# Patient Record
Sex: Female | Born: 1965 | Hispanic: No | Marital: Married | Smoking: Never smoker
Health system: Southern US, Community
[De-identification: ages and names within clinical notes are randomized; demographics above are authoritative.]

## PROBLEM LIST (undated history)

## (undated) ENCOUNTER — Ambulatory Visit: Discharge status: Absent without leave | Payer: Self-pay

## (undated) DIAGNOSIS — I5022 Chronic systolic (congestive) heart failure: Secondary | ICD-10-CM

## (undated) DIAGNOSIS — M722 Plantar fascial fibromatosis: Secondary | ICD-10-CM

## (undated) DIAGNOSIS — E66811 Obesity, class 1: Secondary | ICD-10-CM

## (undated) DIAGNOSIS — I493 Ventricular premature depolarization: Secondary | ICD-10-CM

## (undated) HISTORY — PX: ABLATION ON ENDOMETRIOSIS: SHX5787

---

## 2006-02-06 ENCOUNTER — Emergency Department: Payer: Self-pay | Admitting: Emergency Medicine

## 2006-02-07 ENCOUNTER — Emergency Department (HOSPITAL_COMMUNITY): Admission: EM | Admit: 2006-02-07 | Discharge: 2006-02-07 | Payer: Self-pay | Admitting: Emergency Medicine

## 2007-04-27 ENCOUNTER — Ambulatory Visit: Payer: Self-pay | Admitting: Internal Medicine

## 2007-08-30 ENCOUNTER — Emergency Department: Payer: Self-pay | Admitting: Emergency Medicine

## 2007-09-04 ENCOUNTER — Encounter: Payer: Self-pay | Admitting: Maternal & Fetal Medicine

## 2007-10-02 ENCOUNTER — Encounter: Payer: Self-pay | Admitting: Maternal and Fetal Medicine

## 2007-12-12 ENCOUNTER — Ambulatory Visit: Payer: Self-pay | Admitting: Certified Nurse Midwife

## 2008-01-25 ENCOUNTER — Observation Stay: Payer: Self-pay | Admitting: Obstetrics and Gynecology

## 2008-03-18 ENCOUNTER — Inpatient Hospital Stay: Payer: Self-pay | Admitting: Certified Nurse Midwife

## 2009-09-09 IMAGING — US US OB DETAIL+14 WK - NRPT [facility]
1 series · 14 of 28 positions shown · non-contrast
Comparison: none

[Series 1: us ob detail+14 wk - nrpt [facility] · 0.37mm/px · 14 of 56 slices shown]
[im 3/56]
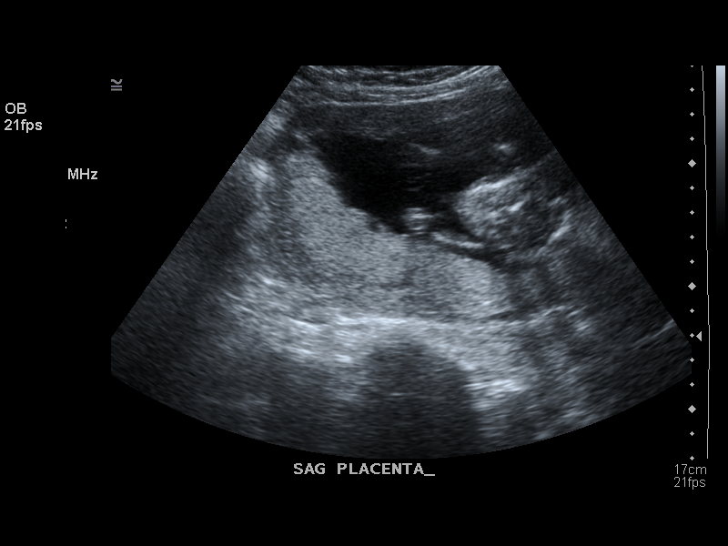
[im 7/56]
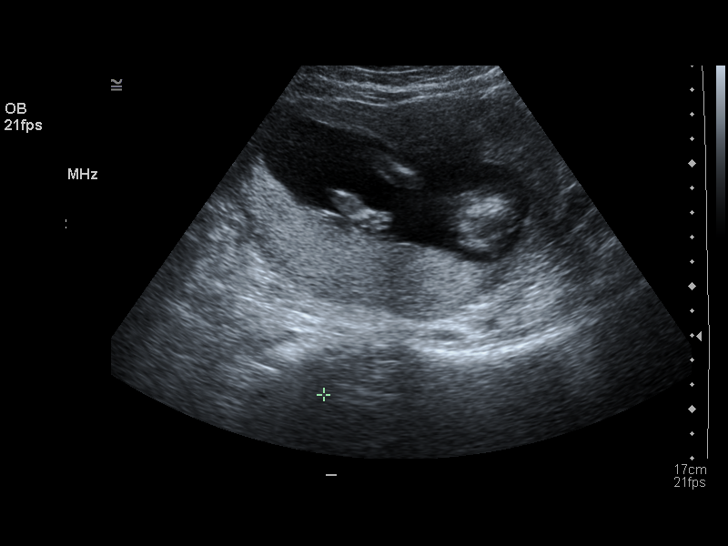
[im 11/56]
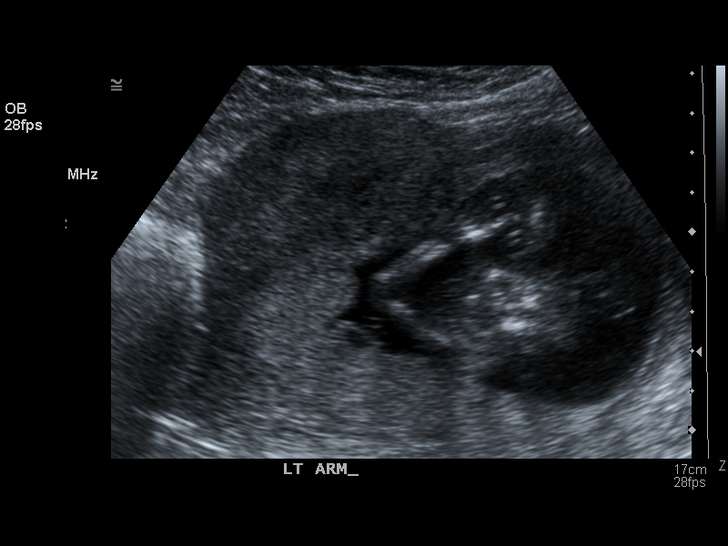
[im 15/56]
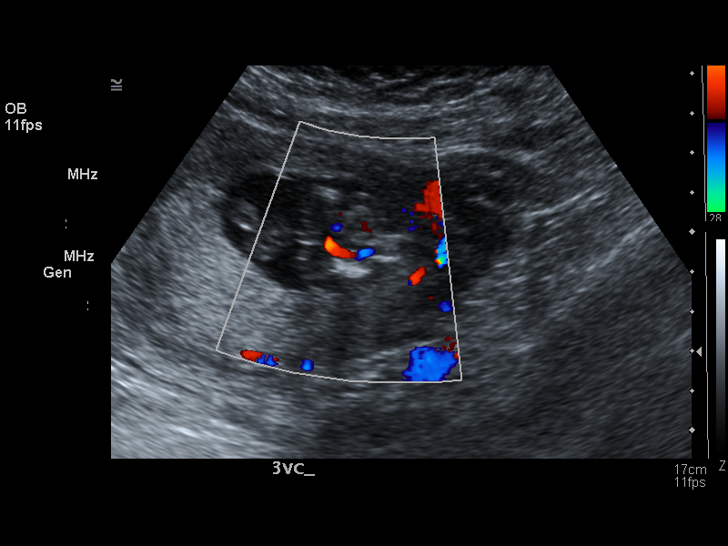
[im 19/56]
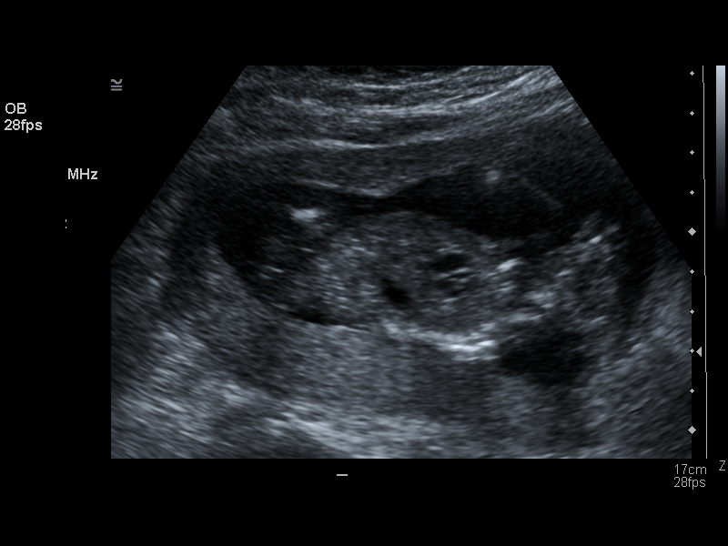
[im 23/56]
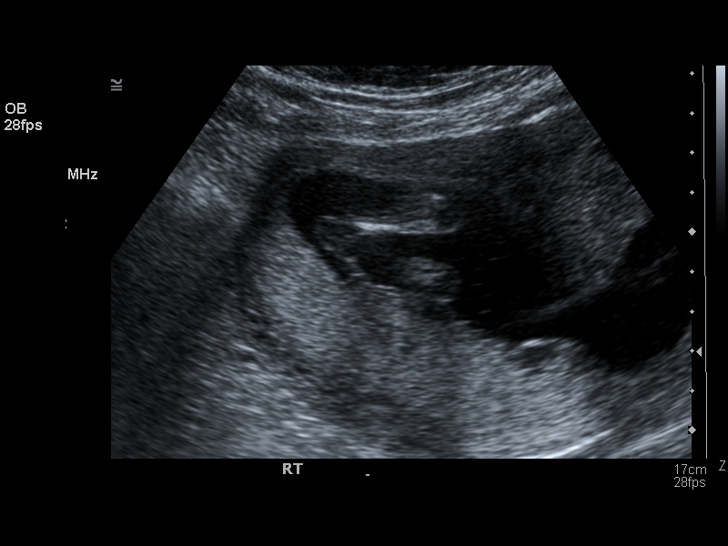
[im 27/56]
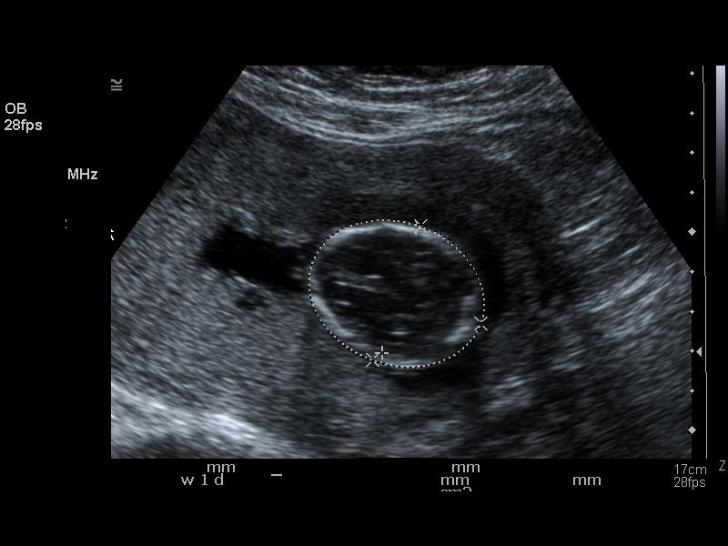
[im 31/56]
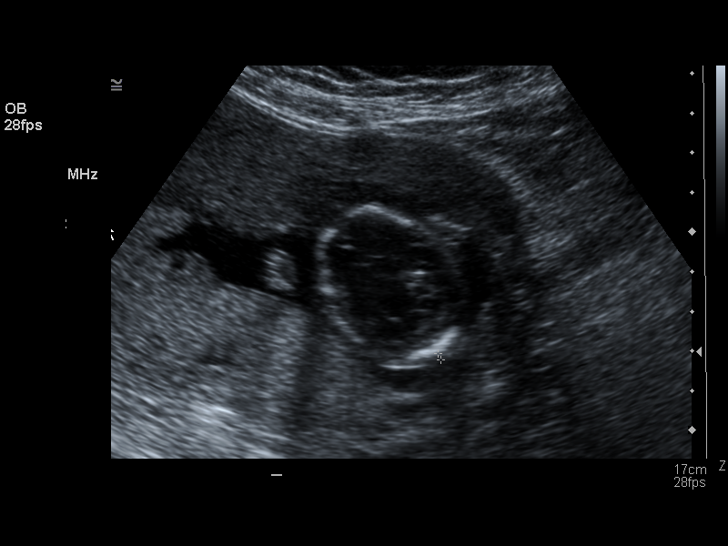
[im 35/56]
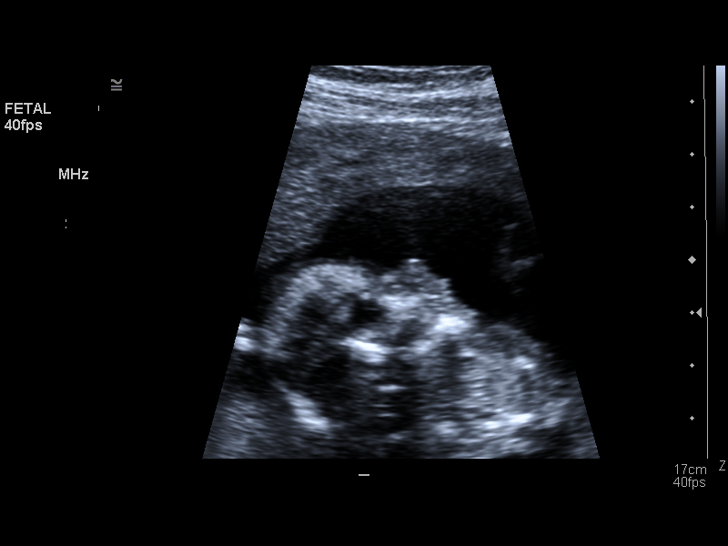
[im 39/56]
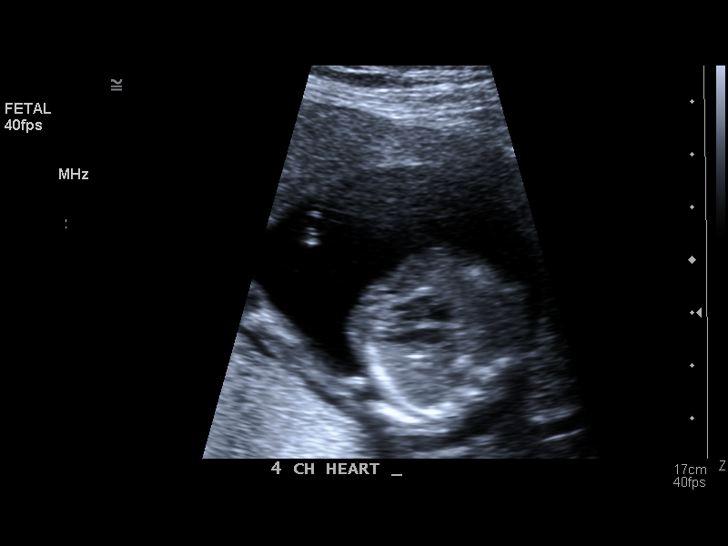
[im 43/56]
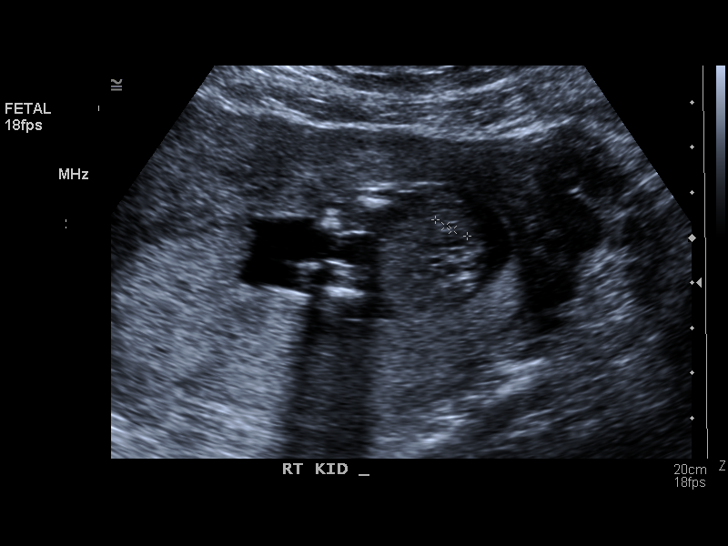
[im 47/56]
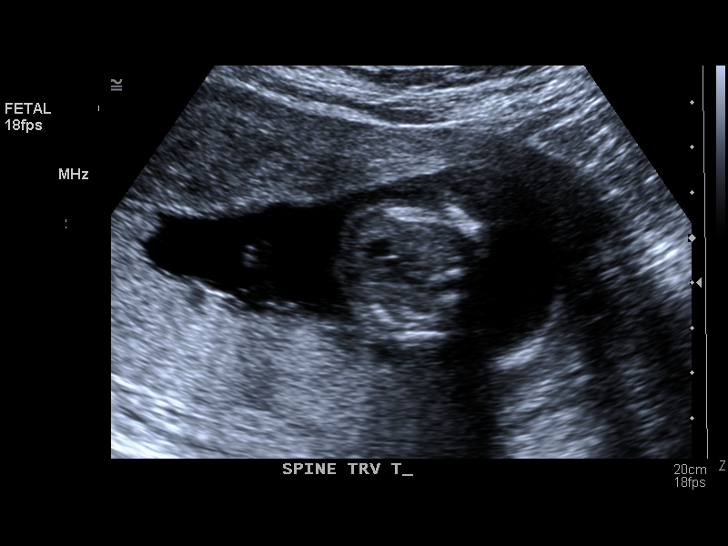
[im 51/56]
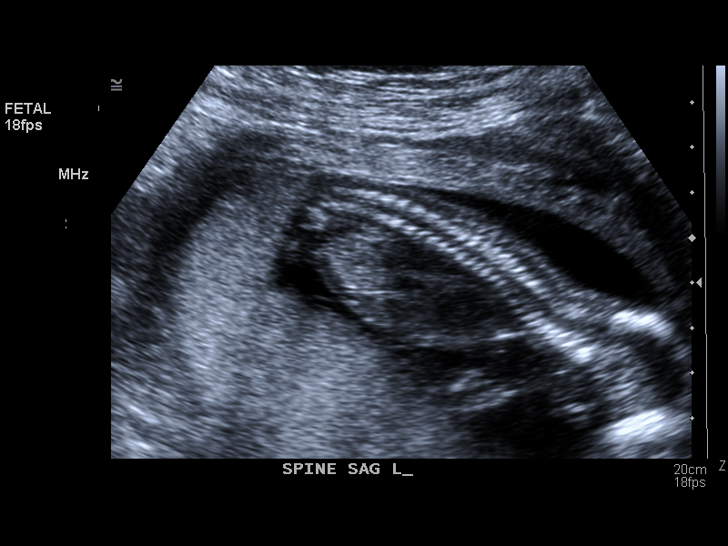
[im 56/56]
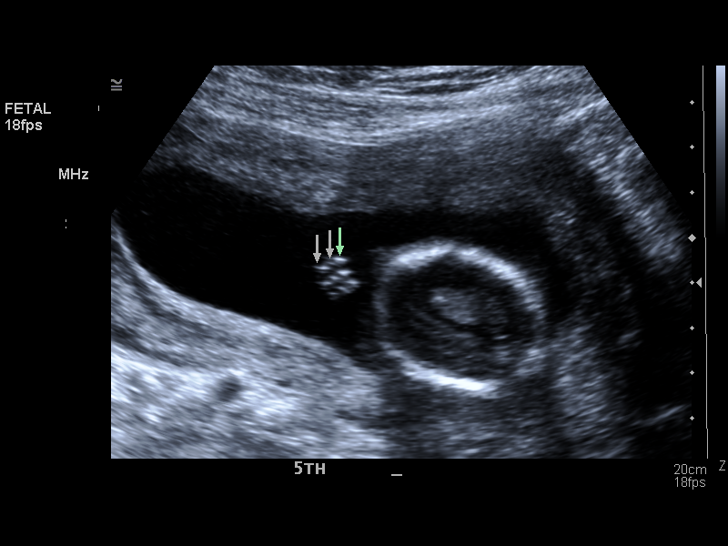

[14 of 28 positions shown; findings below may reference images not displayed]

IMAGES IMPORTED FROM THE [employer] WORKFLOW SYSTEM
NO DICTATION FOR STUDY

## 2016-02-19 NOTE — Progress Notes (Signed)
 Established Patient Visit   Chief Complaint: Chief Complaint  Patient presents with  . Follow-up    echo-metoprolol had side effects pt stopped   Date of Service: 02/19/2016 Date of Birth: 02-Aug-1965 PCP: MEADE AVELINA CREW, DO  History of Present Illness: Ms. April Ibarra is a 50 y.o.female patient who returns for palpitations, premature ventricular contractions, and shortness of breath.  The patient was recently seen by her primary care physician, with EKG revealing sinus rhythm with frequent PVCs.  The patient reported frequent palpitations and occasional fluttering, which has improved since her initial visit.  24-hour Holter monitor was performed which revealed predominant normal sinus rhythm, with frequent premature ventricular contractions, and infrequent couplets and triplets.  2D echocardiogram 02/11/2016 revealed near normal left ventricular function with LVEF 45-50% with mild mitral and tricuspid regurgitation.  Past Medical and Surgical History  Past Medical History History reviewed. No pertinent past medical history.  Past Surgical History She has a past surgical history that includes Laparoscopic Removal Ectopic Pregnancy (Left, 1997).   Medications and Allergies  Current Medications  No current outpatient prescriptions on file.   No current facility-administered medications for this visit.     Allergies: Metoprolol  Social and Family History  Social History  reports that she has never smoked. She has never used smokeless tobacco. She reports that she does not drink alcohol or use illicit drugs.  Family History Family History  Problem Relation Age of Onset  . Hypertension Mother   . Arrhythmia Mother     in older age  . No Known Problems Sister   . No Known Problems Brother   . No Known Problems Daughter   . No Known Problems Son   . Osteoarthritis Maternal Grandmother     Review of Systems   Review of Systems: The patient denies chest pain, reports  exertional shortness of breath, without orthopnea, paroxysmal nocturnal dyspnea, pedal edema, with frequent palpitations, heart racing, without presyncope, syncope. Review of 12 Systems is negative except as described above.  Physical Examination   Vitals:BP (P) 100/60  Pulse (P) 62  Ht (P) 168.9 cm (5' 6.5)  Wt (!) (P) 101.6 kg (224 lb)  BMI (P) 35.61 kg/m2 Ht:(P) 168.9 cm (5' 6.5) Wt:(!) (P) 101.6 kg (224 lb) ADJ:Anib surface area is 2.18 meters squared (pended). Body mass index is 35.61 kg/(m^2) (pended).  HEENT: Pupils equally reactive to light and accomodation  Neck: Supple without thyromegaly, carotid pulses 2+ Lungs: clear to auscultation bilaterally; no wheezes, rales, rhonchi Heart: Regular rate and rhythm.  No gallops, murmurs or rub Abdomen: soft nontender, nondistended, with normal bowel sounds Extremities: no cyanosis, clubbing, or edema Peripheral Pulses: 2+ in all extremities, 2+ femoral pulses bilaterally  Assessment   50 y.o. female with  1. Premature ventricular contractions   2. SOB (shortness of breath) on exertion    50 year old female with symptomatic, frequent premature ventricular contractions, with infrequent couplets and triplets, associated with exertional dyspnea, which has improved after decreasing her caffeine intake.  2D echocardiogram revealed near normal left ventricular function with LV ejection fraction of 45-50%.    Plan   1.  Continue current medication 2.  Counseled patient about limiting her caffeine intake 3.  Instructed patient to resume her usual activity 4.  Instruct the patient to exercise regularly 5.  Return to clinic for follow-up in 4 months  No orders of the defined types were placed in this encounter.   No Follow-up on file.  MARSA DOOMS, MD

## 2016-09-01 ENCOUNTER — Ambulatory Visit
Admission: EM | Admit: 2016-09-01 | Discharge: 2016-09-01 | Disposition: A | Discharge status: Home / Self Care | Payer: Self-pay | Attending: Family Medicine | Admitting: Family Medicine

## 2016-09-01 ENCOUNTER — Encounter: Payer: Self-pay | Admitting: *Deleted

## 2016-09-01 ENCOUNTER — Ambulatory Visit (INDEPENDENT_AMBULATORY_CARE_PROVIDER_SITE_OTHER): Payer: Self-pay

## 2016-09-01 DIAGNOSIS — M722 Plantar fascial fibromatosis: Secondary | ICD-10-CM

## 2016-09-01 DIAGNOSIS — S93402A Sprain of unspecified ligament of left ankle, initial encounter: Secondary | ICD-10-CM

## 2016-09-01 DIAGNOSIS — S92403A Displaced unspecified fracture of unspecified great toe, initial encounter for closed fracture: Secondary | ICD-10-CM

## 2016-09-01 HISTORY — DX: Plantar fascial fibromatosis: M72.2

## 2016-09-01 MED ORDER — MELOXICAM 15 MG PO TABS
15.0000 mg | ORAL_TABLET | Freq: Every day | ORAL | 0 refills | Status: DC | PRN
Start: 2016-09-01 — End: 2018-05-26

## 2016-09-01 NOTE — Discharge Instructions (Signed)
Take medication as prescribed. Rest. Ice. Stretch. Wear good supportive shoes.   Follow up with podiatry as discussed. See above to call to schedule.   Follow up with your primary care physician this week as needed. Return to Urgent care for new or worsening concerns.

## 2016-09-01 NOTE — ED Triage Notes (Signed)
Left ankle pain x 1-2 weeks. No specific injury but pt has been running and has hx of plantar fasciitis.

## 2016-09-01 NOTE — ED Provider Notes (Signed)
[facility] ____________________________________________  Time seen: Approximately 2:13 PM  I have reviewed the triage vital signs and the nursing notes.   HISTORY  Chief Complaint Ankle Pain  HPI April Ibarra is a 51 y.o. female present for the complaints of left foot and left ankle pain. Patient reports that this is been going on for about 2 months however increase about 1.5 weeks ago after an injury. Patient reports that she does have a history of plantar fasciitis to her right foot and had very similar pains to her left foot over the last 2 months. Patient however reports that with the pain she accidentally rolled her left ankle 1.5 weeks ago and has had increased pain to the side of her foot and her ankle. Reports has been doing her stretches as well as applying ice to the left heel with similar plantar fasciitis with minimal improvement. States has been taking intermittent over-the-counter Aleve with no resolution. Patient reports even after injury 1.5 weeks ago she has remained ambulatory. States mild pain to lateral ankle, mild to moderate pain to left heel. Patient states pain is worsened first thing in the morning with several steps as well as after being on her feet all day. Denies pain radiation, paresthesias or decreased range of motion. Denies any redness or skin changes.  Denies chest pain, shortness of breath, abdominal pain, or rash. Denies recent sickness. Denies recent antibiotic use.   Patient's last menstrual period was 08/19/2016 (within days). Denies pregnancy.   Past Medical History:  Diagnosis Date  . Plantar fascia syndrome     There are no active problems to display for this patient.   History reviewed. No pertinent surgical history.   No current facility-administered medications for this encounter.   Current Outpatient Prescriptions:  .  meloxicam (MOBIC) 15 MG tablet, Take 1 tablet (15 mg total) by mouth daily as needed., Disp: 10  tablet, Rfl: 0  Allergies Patient has no known allergies.  History reviewed. No pertinent family history.  Social History Social History  Substance Use Topics  . Smoking status: Never Smoker  . Smokeless tobacco: Never Used  . Alcohol use No    Review of Systems Constitutional: No fever/chills Eyes: No visual changes. ENT: No sore throat. Cardiovascular: Denies chest pain. Respiratory: Denies shortness of breath. Gastrointestinal: No abdominal pain.  No nausea, no vomiting.  No diarrhea.  No constipation. Genitourinary: Negative for dysuria. Musculoskeletal: Negative for back pain. Skin: Negative for rash. Neurological: Negative for headaches, focal weakness or numbness.  10-point ROS otherwise negative.  ____________________________________________   PHYSICAL EXAM:  VITAL SIGNS: ED Triage Vitals  Enc Vitals Group     BP 09/01/16 1130 122/62     Pulse Rate 09/01/16 1130 64     Resp 09/01/16 1130 16     Temp 09/01/16 1130 97.8 F (36.6 C)     Temp Source 09/01/16 1130 Oral     SpO2 09/01/16 1130 99 %     Weight 09/01/16 1134 212 lb (96.2 kg)     Height 09/01/16 1134  (1.753 m)     Head Circumference --      Peak Flow --      Pain Score 09/01/16 1330 3     Pain Loc --      Pain Edu? --      Excl. in GC? --     Constitutional: Alert and oriented. Well appearing and in no acute distress. Cardiovascular: Normal rate, regular rhythm. Grossly normal  heart sounds.  Good peripheral circulation. Respiratory: Normal respiratory effort without tachypnea nor retractions. Breath sounds are clear and equal bilaterally. No wheezes, rales, rhonchi. Musculoskeletal:No midline cervical, thoracic or lumbar tenderness to palpation. Bilateral pedal pulses equal and easily palpated. Except: Left plantar heel mild to moderate tenderness to direct palpation, no swelling, no ecchymosis, no erythema. Left lateral ankle minimal swelling with mild tenderness to direct palpation, no  erythema, full range of motion but pain present with ankle rotation. Mild pain present with left plantar flexion and dorsiflexion. Normal distal sensation to left foot toes. Ambulatory with mild antalgic gait. Neurologic:  Normal speech and language.Speech is normal. No gait instability.  Skin:  Skin is warm, dry Psychiatric: Mood and affect are normal. Speech and behavior are normal. Patient exhibits appropriate insight and judgment   ___________________________________________   LABS (all labs ordered are listed, but only abnormal results are displayed)  Labs Reviewed - No data to display  RADIOLOGY  Dg Ankle Complete Left  Result Date: 09/01/2016 CLINICAL DATA:  51 year old female with twisting injury to the left ankle 1 and a half weeks ago while walking down stairs. Pain in the left ankle. Pain beneath the left heel. EXAM: LEFT ANKLE COMPLETE - 3+ VIEW COMPARISON:  No priors. FINDINGS: Soft tissue thickening along the plantar aspect of the calcaneus. No acute displaced fracture, subluxation or dislocation. IMPRESSION: 1. Mild soft tissue thickening along the plantar aspect of the calcaneus, which could be related to underlying plantar fasciitis. Clinical correlation is recommended. 2. No acute osseous abnormality in the left ankle. Electronically Signed   By: Trudie Reed M.D.   On: 09/01/2016 13:09   ____________________________________________   PROCEDURES Procedures    INITIAL IMPRESSION / ASSESSMENT AND PLAN / ED COURSE  Pertinent labs & imaging results that were available during my care of the patient were reviewed by me and considered in my medical decision making (see chart for details).  Well-appearing patient. No acute distress. Suspect left plantar fasciitis and left ankle sprain. Leftankle x-ray per radiologist mild soft tissues thickening along the plantar aspect of the calcaneus to be related to plantar fasciitis, no acute osseous abnormality of the left ankle.  Left post op shoe given. Will start patient on oral daily Mobic. Encouraged rest, ice and stretching. Follow-up with podiatry. Information given.Discussed indication, risks and benefits of medications with patient.   Discussed follow up with Primary care physician this week. Discussed follow up and return parameters including no resolution or any worsening concerns. Patient verbalized understanding and agreed to plan.   ____________________________________________   FINAL CLINICAL IMPRESSION(S) / ED DIAGNOSES  Final diagnoses:  Plantar fasciitis, left  Sprain of left ankle, unspecified ligament, initial encounter     Discharge Medication List as of 09/01/2016  1:19 PM    START taking these medications   Details  meloxicam (MOBIC) 15 MG tablet Take 1 tablet (15 mg total) by mouth daily as needed., Starting Wed 09/01/2016, Normal        Note: This dictation was prepared with Dragon dictation along with smaller phrase technology. Any transcriptional errors that result from this process are unintentional.         Renford Dills, NP 09/01/16 1422

## 2017-12-19 ENCOUNTER — Inpatient Hospital Stay: Payer: BLUE CROSS/BLUE SHIELD | Admitting: Oncology

## 2017-12-26 ENCOUNTER — Encounter: Payer: Self-pay | Admitting: Oncology

## 2017-12-26 ENCOUNTER — Inpatient Hospital Stay: Payer: BLUE CROSS/BLUE SHIELD | Attending: Oncology | Admitting: Oncology

## 2017-12-26 VITALS — BP 110/52 | HR 43 | Temp 98.7°F | Ht 69.0 in | Wt 226.4 lb

## 2017-12-26 DIAGNOSIS — M722 Plantar fascial fibromatosis: Secondary | ICD-10-CM | POA: Diagnosis not present

## 2017-12-26 DIAGNOSIS — Z8249 Family history of ischemic heart disease and other diseases of the circulatory system: Secondary | ICD-10-CM | POA: Insufficient documentation

## 2017-12-26 NOTE — Progress Notes (Signed)
Hematology/Oncology Consult note [facility] Telephone:([phone] [phone]) [phone]  Patient Care Team: Dione Housekeeperlmedo, Mario Ernesto, MD as PCP - General (Family Medicine)   Name of the patient: April SchaumannSuzanne Ibarra  [id number]  1965-11-21    Reason for referral-family history of DVT   Referring physician-Dr. Zada Finderslmedo  Date of visit: 12/26/17   History of presenting illness-patient is a 52 year old female with no significant medical comorbidities.  Her brother who is 1647 was diagnosed with DVT about 3 months back and her other brother who was in his 8050s was diagnosed with a DVT and a PE recently.  She was told that they have a genetic abnormality and hence she is here for further testing.  She is not exactly sure what genetic abnormality they were tested for but believes that they were tested for MTHFR and prothrombin mutation which was positive.  Patient has herself never had a DVT or PE.  She does not currently smoke or use any birth controls.  She has 5 living children.  She did have a stillbirth at 8 months secondary to spina bifida.  She also reports 3 miscarriages in her first trimester of pregnancy.  Her mother has not had any DVT or PE but she is on Eliquis for atrial fibrillation.  She is not aware of any medical history about her father.  She is currently doing well and denies any complaints  ECOG PS- 0  Pain scale- 0   Review of systems- Review of Systems  Constitutional: Negative for chills, fever, malaise/fatigue and weight loss.  HENT: Negative for congestion, ear discharge and nosebleeds.   Eyes: Negative for blurred vision.  Respiratory: Negative for cough, hemoptysis, sputum production, shortness of breath and wheezing.   Cardiovascular: Negative for chest pain, palpitations, orthopnea and claudication.  Gastrointestinal: Negative for abdominal pain, blood in stool, constipation, diarrhea, heartburn, melena, nausea and vomiting.  Genitourinary: Negative  for dysuria, flank pain, frequency, hematuria and urgency.  Musculoskeletal: Negative for back pain, joint pain and myalgias.  Skin: Negative for rash.  Neurological: Negative for dizziness, tingling, focal weakness, seizures, weakness and headaches.  Endo/Heme/Allergies: Does not bruise/bleed easily.  Psychiatric/Behavioral: Negative for depression and suicidal ideas. The patient does not have insomnia.     No Known Allergies  There are no active problems to display for this patient.    Past Medical History:  Diagnosis Date  . Plantar fascia syndrome      History reviewed. No pertinent surgical history.  Social History   Socioeconomic History  . Marital status: Married    Spouse name: Not on file  . Number of children: Not on file  . Years of education: Not on file  . Highest education level: Not on file  Occupational History  . Not on file  Social Needs  . Financial resource strain: Not on file  . Food insecurity:    Worry: Not on file    Inability: Not on file  . Transportation needs:    Medical: Not on file    Non-medical: Not on file  Tobacco Use  . Smoking status: Never Smoker  . Smokeless tobacco: Never Used  Substance and Sexual Activity  . Alcohol use: No  . Drug use: Not on file  . Sexual activity: Not on file  Lifestyle  . Physical activity:    Days per week: Not on file    Minutes per session: Not on file  . Stress: Not on file  Relationships  . Social connections:  Talks on phone: Not on file    Gets together: Not on file    Attends religious service: Not on file    Active member of club or organization: Not on file    Attends meetings of clubs or organizations: Not on file    Relationship status: Not on file  . Intimate partner violence:    Fear of current or ex partner: Not on file    Emotionally abused: Not on file    Physically abused: Not on file    Forced sexual activity: Not on file  Other Topics Concern  . Not on file  Social  History Narrative  . Not on file     History reviewed. No pertinent family history.   Current Outpatient Medications:  .  meloxicam (MOBIC) 15 MG tablet, Take 1 tablet (15 mg total) by mouth daily as needed. (Patient not taking: Reported on 12/26/2017), Disp: 10 tablet, Rfl: 0   Physical exam:  Vitals:   12/26/17 1108  BP: (!) 110/52  Pulse: (!) 43  Temp: 98.7 F (37.1 C)  TempSrc: Tympanic  SpO2: 97%  Weight: 226 lb 6.6 oz (102.7 kg)  Height: 5\' 9"  (1.753 m)   Physical Exam  Constitutional: She is oriented to person, place, and time. She appears well-developed and well-nourished.  HENT:  Head: Normocephalic and atraumatic.  Eyes: Pupils are equal, round, and reactive to light. EOM are normal.  Neck: Normal range of motion.  Cardiovascular: Normal rate, regular rhythm and normal heart sounds.  Pulmonary/Chest: Effort normal and breath sounds normal.  Abdominal: Soft. Bowel sounds are normal.  Neurological: She is alert and oriented to person, place, and time.  Skin: Skin is warm and dry.     Assessment and plan- Patient is a [age] y.o. female referred for family history of DVT and possible hypercoagulability  Patient's brothers have history of DVT and PE and it is unclear as to what genetic testing they have undergone.  I have requested that the patient should get in touch with her brother and let us know exactly what they were tested for so that we can test her for the same test.  Given the absence of personal history of DVT and PE she does not need a comprehensive hypercoagulable work-up at this time.  Also I would not recommend MTHFR testing as American Society of hematology recommends against doing MTHFR testing for hypercoagulability and it does not change management regardless of homozygosity or heterozygosity and I would therefore not be testing the same at this time.  Even if patient were to have any genetic predisposition for DVT or PE, in the absence of personal history  she would not need any prophylactic anticoagulation.  Based on her obstetric history she does not meet criteria for antiphospholipid antibody syndrome as patient has had first trimester pregnancy losses and her stillbirth at 8 months was secondary to spina bifid and does not appear to be related to hypercoagulability.  After patient gives us a call and let us know exactly what testing her brothers had we will order that test accordingly and I will see her back tentatively in 2 weeks time to discuss the results of the test and further counseling   Thank you for this kind referral and the opportunity to participate in the care of this patient   Visit Diagnosis 1. Family history of DVT     Dr. Owens SharkArchana Ariane Ditullio, MD, MPH [facility] [phone] 12/26/2017  4:53 PM

## 2018-01-09 ENCOUNTER — Inpatient Hospital Stay: Payer: BLUE CROSS/BLUE SHIELD | Admitting: Oncology

## 2018-05-26 ENCOUNTER — Encounter: Payer: Self-pay | Admitting: Emergency Medicine

## 2018-05-26 ENCOUNTER — Ambulatory Visit
Admission: EM | Admit: 2018-05-26 | Discharge: 2018-05-26 | Disposition: A | Discharge status: Home / Self Care | Payer: BLUE CROSS/BLUE SHIELD | Attending: Family Medicine | Admitting: Family Medicine

## 2018-05-26 ENCOUNTER — Other Ambulatory Visit: Payer: Self-pay

## 2018-05-26 DIAGNOSIS — J069 Acute upper respiratory infection, unspecified: Secondary | ICD-10-CM | POA: Diagnosis not present

## 2018-05-26 MED ORDER — BENZONATATE 200 MG PO CAPS: ORAL_CAPSULE | ORAL | 0 refills | Status: DC

## 2018-05-26 MED ORDER — AZITHROMYCIN 250 MG PO TABS: 250.0000 mg | ORAL_TABLET | Freq: Every day | ORAL | 0 refills | Status: DC

## 2018-05-26 MED ORDER — HYDROCOD POLST-CPM POLST ER 10-8 MG/5ML PO SUER: 5.0000 mL | Freq: Two times a day (BID) | ORAL | 0 refills | Status: DC

## 2018-05-26 NOTE — ED Triage Notes (Signed)
Patient  c/o cough and chest congestion for over a week.   

## 2018-05-26 NOTE — ED Provider Notes (Signed)
[facility]    CSN: [id number] Arrival date & time: 05/26/18  1755     History   Chief Complaint Chief Complaint  Patient presents with  . Cough    HPI April Ibarra is a 53 y.o. female.   HPI  -year-old female presents with cough and chest congestion that she has had actually since Christmas.  The cold from her husband.  He recovered in 4 days.  Now she has coughing during the day but mostly at nighttime keeping her awake.  Productive of yellow-green sputum.  She is a non-smoker.  No fever or chills.       Past Medical History:  Diagnosis Date  . Plantar fascia syndrome     There are no active problems to display for this patient.   History reviewed. No pertinent surgical history.  OB History   No obstetric history on file.      Home Medications    Prior to Admission medications   Medication Sig Start Date End Date Taking? Authorizing Provider  azithromycin (ZITHROMAX) 250 MG tablet Take 1 tablet (250 mg total) by mouth daily. Take first 2 tablets together, then 1 every day until finished. 05/26/18   Lutricia Feiloemer, Tylah Mancillas P, PA-C  benzonatate (TESSALON) 200 MG capsule Take one cap TID PRN cough 05/26/18   Lutricia Feiloemer, Lavonne Kinderman P, PA-C  chlorpheniramine-HYDROcodone [facility](TUSSIONEX PENNKINETIC ER) 10-8 MG/5ML SUER Take 5 mLs by mouth 2 (two) times daily. 05/26/18   Lutricia Feiloemer, Alisabeth Selkirk P, PA-C    Family History Family History  Problem Relation Age of Onset  . Hypertension Mother   . Arrhythmia Mother   . Hypertension Father     Social History Social History   Tobacco Use  . Smoking status: Never Smoker  . Smokeless tobacco: Never Used  Substance Use Topics  . Alcohol use: No  . Drug use: Not on file     Allergies   Patient has no known allergies.   Review of Systems Review of Systems  Constitutional: Positive for activity change. Negative for appetite change, chills, diaphoresis, fatigue and fever.  HENT: Positive for congestion and postnasal  drip.   Respiratory: Positive for cough.   All other systems reviewed and are negative.    Physical Exam Triage Vital Signs ED Triage Vitals  Enc Vitals Group     BP 05/26/18 1842 126/66     Pulse Rate 05/26/18 1842 81     Resp 05/26/18 1842 16     Temp 05/26/18 1842 98 F (36.7 C)     Temp Source 05/26/18 1842 Oral     SpO2 05/26/18 1842 100 %     Weight 05/26/18 1840 210 lb (95.3 kg)     Height 05/26/18 1840 5\' 7"  (1.702 m)     Head Circumference --      Peak Flow --      Pain Score 05/26/18 1840 0     Pain Loc --      Pain Edu? --      Excl. in GC? --    No data found.  Updated Vital Signs BP 126/66 (BP Location: Left Arm)   Pulse 81   Temp 98 F (36.7 C) (Oral)   Resp 16   Ht 5\' 7"  (1.702 m)   Wt 210 lb (95.3 kg)   LMP 05/09/2018 (Approximate)   SpO2 100%   BMI 32.89 kg/m   Visual Acuity Right Eye Distance:   Left Eye Distance:   Bilateral Distance:  Right Eye Near:   Left Eye Near:    Bilateral Near:     Physical Exam Vitals signs and nursing note reviewed.  Constitutional:      General: She is not in acute distress.    Appearance: Normal appearance. She is not ill-appearing, toxic-appearing or diaphoretic.  HENT:     Head: Normocephalic.     Right Ear: Tympanic membrane, ear canal and external ear normal.     Left Ear: Tympanic membrane, ear canal and external ear normal.     Nose: Nose normal. No congestion or rhinorrhea.     Mouth/Throat:     Mouth: Mucous membranes are moist.     Pharynx: Oropharynx is clear. No oropharyngeal exudate or posterior oropharyngeal erythema.  Eyes:     General:        Right eye: No discharge.        Left eye: No discharge.     Conjunctiva/sclera: Conjunctivae normal.  Neck:     Musculoskeletal: Normal range of motion.  Pulmonary:     Effort: Pulmonary effort is normal.     Breath sounds: Normal breath sounds.  Musculoskeletal: Normal range of motion.  Lymphadenopathy:     Cervical: No cervical  adenopathy.  Skin:    General: Skin is warm and dry.  Neurological:     General: No focal deficit present.     Mental Status: She is alert and oriented to person, place, and time.  Psychiatric:        Mood and Affect: Mood normal.        Behavior: Behavior normal.        Thought Content: Thought content normal.        Judgment: Judgment normal.      UC Treatments / Results  Labs (all labs ordered are listed, but only abnormal results are displayed) Labs Reviewed - No data to display  EKG None  Radiology No results found.  Procedures Procedures (including critical care time)  Medications Ordered in UC Medications - No data to display  Initial Impression / Assessment and Plan / UC Course  I have reviewed the triage vital signs and the nursing notes.  Pertinent labs & imaging results that were available during my care of the patient were reviewed by me and considered in my medical decision making (see chart for details).   Patient has an upper respiratory infection that has persisted since December 25.  Provide her with cough suppression with Tessalon Perles for daytime and Tussionex at night.  At that time she has had the cough I will also give her azithromycin to see if that will help.  If it does not then this is definitely a virus and will have to run its course unfortunately.  Not improving or worsening she should follow-up with her primary care physician  Final Clinical Impressions(s) / UC Diagnoses   Final diagnoses:  Upper respiratory tract infection, unspecified type   Discharge Instructions   None    ED Prescriptions    Medication Sig Dispense Auth. Provider   benzonatate (TESSALON) 200 MG capsule Take one cap TID PRN cough 30 capsule Lutricia Feil, PA-C   chlorpheniramine-HYDROcodone (TUSSIONEX PENNKINETIC ER) 10-8 MG/5ML SUER Take 5 mLs by mouth 2 (two) times daily. 115 mL Ovid Curd P, PA-C   azithromycin (ZITHROMAX) 250 MG tablet Take 1 tablet  (250 mg total) by mouth daily. Take first 2 tablets together, then 1 every day until finished. 6 tablet Lutricia Feil, PA-C  Controlled Substance Prescriptions  Controlled Substance Registry consulted? Not Applicable   Lutricia FeilRoemer, Ahmarion Saraceno P, PA-C 05/26/18 1909

## 2018-08-10 IMAGING — CR DG ANKLE COMPLETE 3+V*L*
3 series · 3 of 3 positions shown · non-contrast
Comparison: No priors.

CLINICAL DATA: 50-year-old female with twisting injury to the left
ankle 1 and a half weeks ago while walking down stairs. Pain in the
left ankle. Pain beneath the left heel.

EXAM:
LEFT ANKLE COMPLETE - 3+ VIEW

[ankle ap]
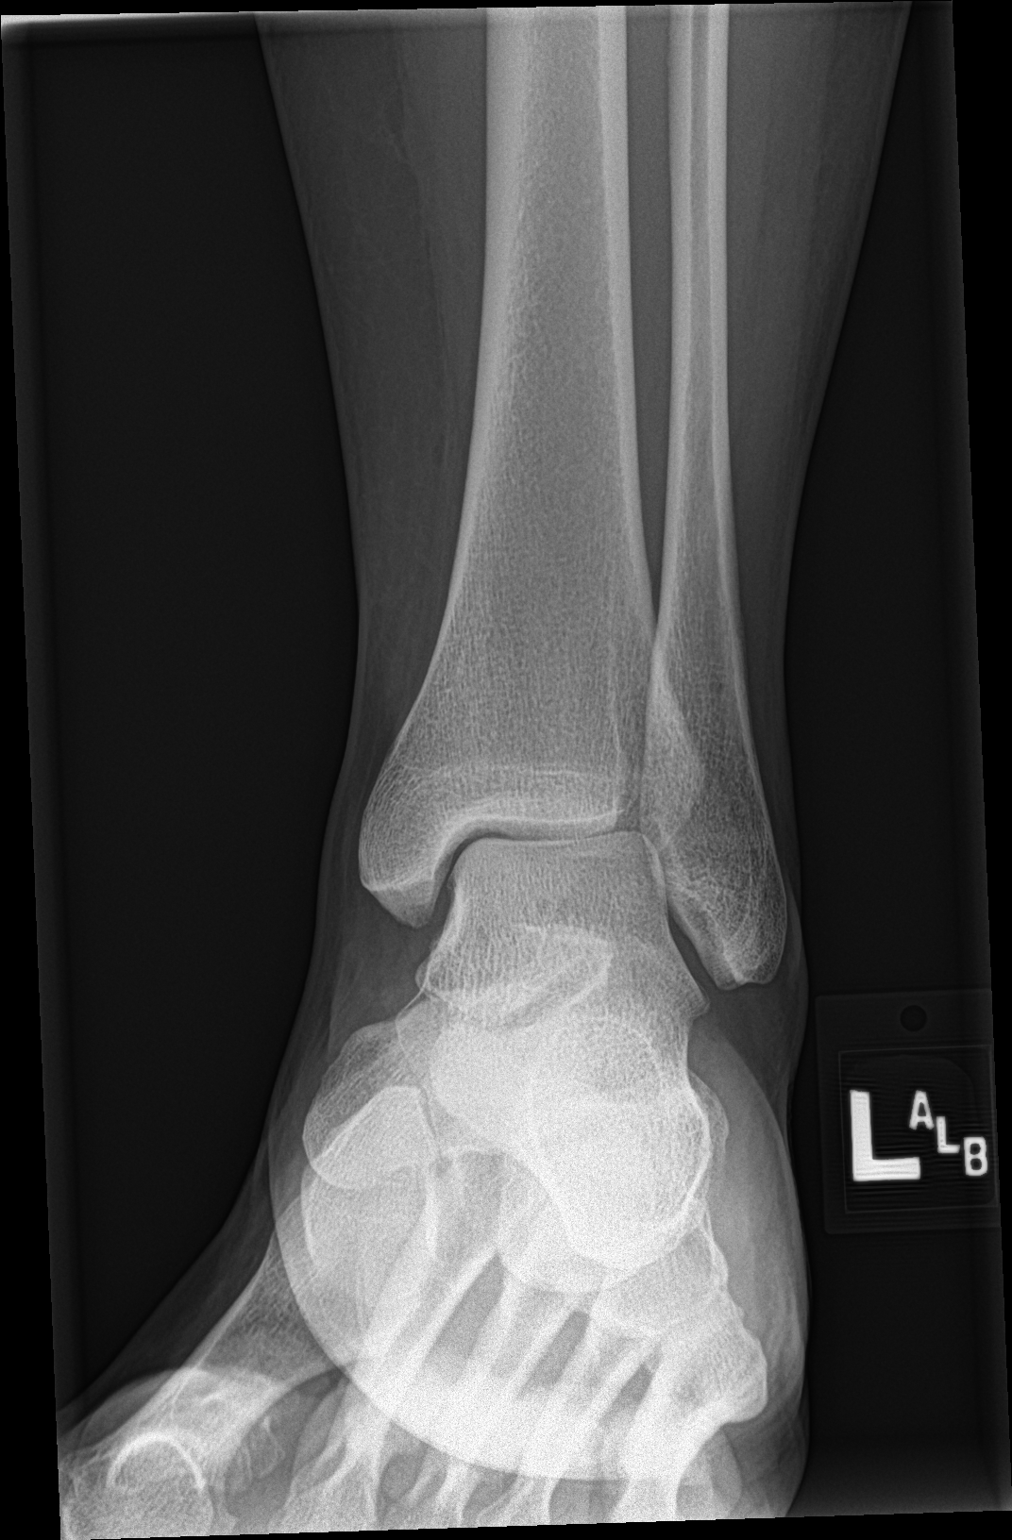

[ankle obl]
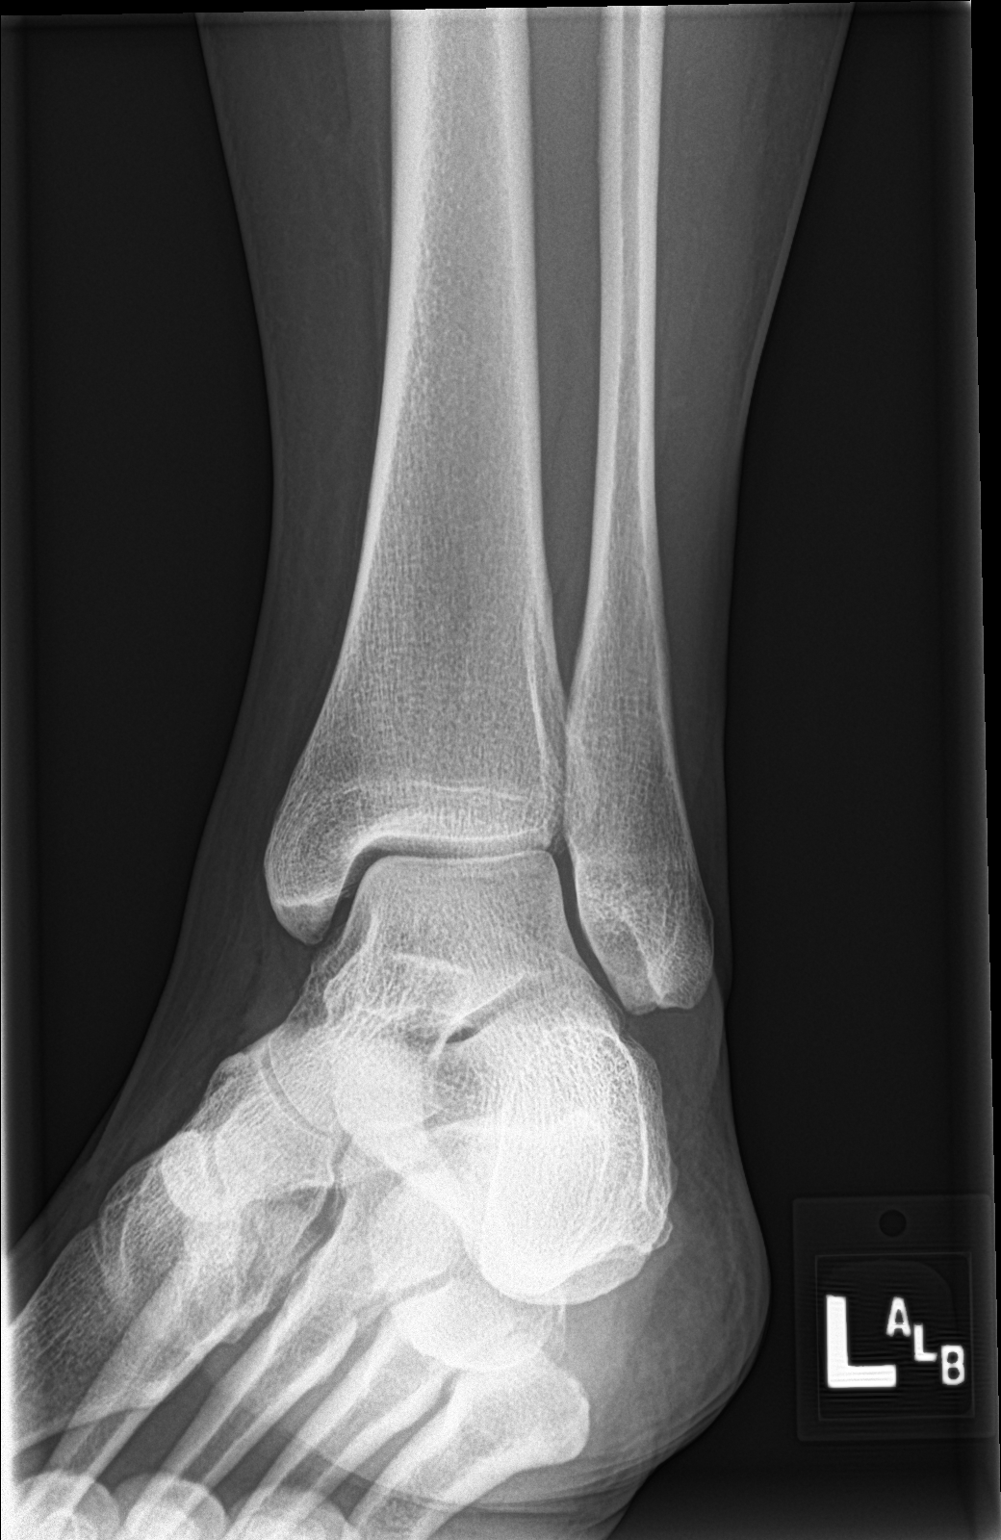

[ankle lat]
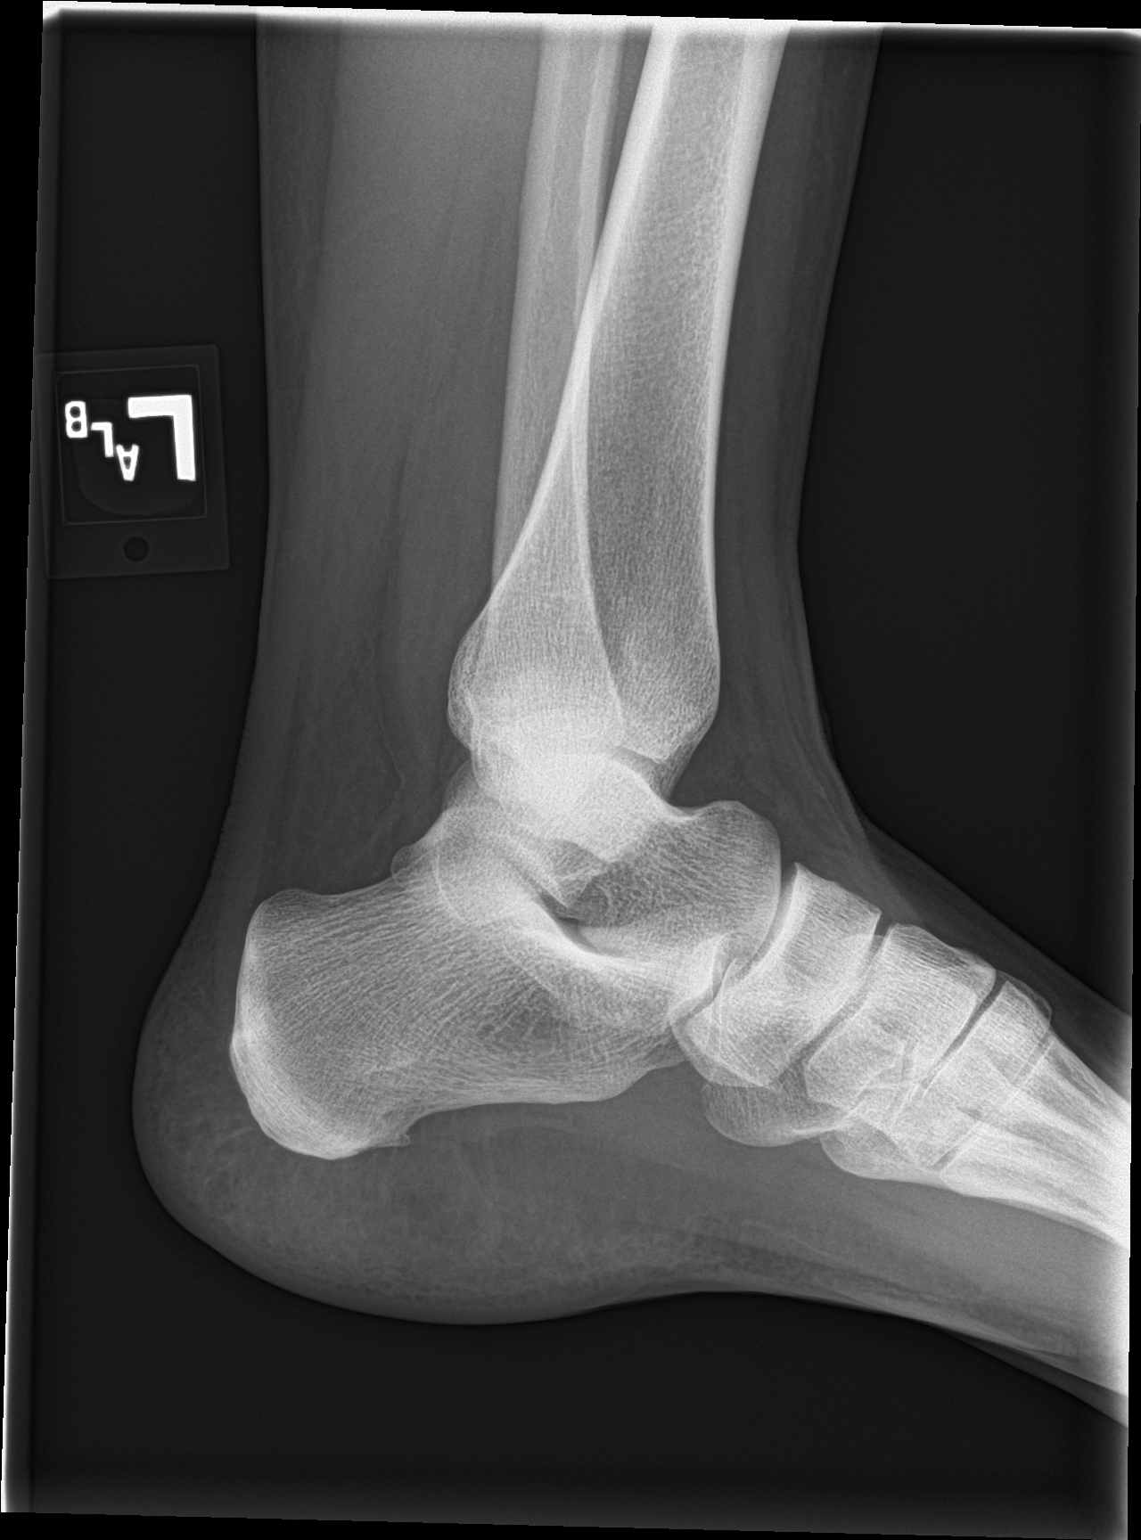

[3 of 3 positions shown; findings below may reference images not displayed]

FINDINGS: Soft tissue thickening along the plantar aspect of the calcaneus. No
acute displaced fracture, subluxation or dislocation.
IMPRESSION: 1. Mild soft tissue thickening along the plantar aspect of the
calcaneus, which could be related to underlying plantar fasciitis.
Clinical correlation is recommended.
2. No acute osseous abnormality in the left ankle.

## 2019-07-10 ENCOUNTER — Other Ambulatory Visit: Payer: Self-pay

## 2019-07-10 DIAGNOSIS — N644 Mastodynia: Secondary | ICD-10-CM

## 2019-07-10 DIAGNOSIS — N6459 Other signs and symptoms in breast: Secondary | ICD-10-CM

## 2019-07-10 DIAGNOSIS — N6310 Unspecified lump in the right breast, unspecified quadrant: Secondary | ICD-10-CM

## 2021-12-01 ENCOUNTER — Encounter: Payer: Self-pay | Admitting: Family Medicine

## 2022-02-19 ENCOUNTER — Ambulatory Visit: Payer: Self-pay | Admitting: Family Medicine

## 2022-02-23 ENCOUNTER — Ambulatory Visit: Payer: Self-pay | Admitting: Family Medicine

## 2022-03-05 ENCOUNTER — Ambulatory Visit
Admission: RE | Admit: 2022-03-05 | Discharge: 2022-03-05 | Disposition: A | Discharge status: Home / Self Care | Payer: BC Managed Care – PPO | Source: Ambulatory Visit | Attending: Family Medicine | Admitting: Family Medicine

## 2022-03-05 VITALS — BP 137/69 | HR 50 | Temp 99.0°F | Resp 14 | Ht 67.0 in | Wt 211.0 lb

## 2022-03-05 DIAGNOSIS — U071 COVID-19: Secondary | ICD-10-CM | POA: Diagnosis not present

## 2022-03-05 DIAGNOSIS — Z20822 Contact with and (suspected) exposure to covid-19: Secondary | ICD-10-CM

## 2022-03-05 LAB — SARS CORONAVIRUS 2 BY RT PCR: SARS Coronavirus 2 by RT PCR: POSITIVE — AB

## 2022-03-05 MED ORDER — PROMETHAZINE-DM 6.25-15 MG/5ML PO SYRP: 5.0000 mL | ORAL_SOLUTION | Freq: Four times a day (QID) | ORAL | 0 refills | Status: AC | PRN

## 2022-03-05 MED ORDER — MOLNUPIRAVIR EUA 200MG CAPSULE: 4.0000 | ORAL_CAPSULE | Freq: Two times a day (BID) | ORAL | 0 refills | Status: AC

## 2022-03-05 MED ORDER — BENZONATATE 200 MG PO CAPS: ORAL_CAPSULE | ORAL | 0 refills | Status: AC

## 2022-03-05 NOTE — ED Provider Notes (Signed)
[facility]    CSN: [id number] Arrival date & time: 03/05/22  9024      History   Chief Complaint Chief Complaint  Patient presents with   Cough    Appointment    HPI April Ibarra is a 56 y.o. female.   HPI   April Ibarra presents with her husband for cough, nasal congestion, runny nose, sore throat and ear pain that started yesterday.  Of note, her husband was diagnosed with COVID earlier this week.  She has felt hot but did not take her temperature.  She has myalgias.  No vomiting or diarrhea.  Symptoms are not allowing her to sleep very well.  He has intermittent headache.  Used Flonase for her congestion without relief.      Past Medical History:  Diagnosis Date   Plantar fascia syndrome     There are no problems to display for this patient.   History reviewed. No pertinent surgical history.  OB History   No obstetric history on file.      Home Medications    Prior to Admission medications   Medication Sig Start Date End Date Taking? Authorizing Provider  molnupiravir EUA (LAGEVRIO) 200 mg CAPS capsule Take 4 capsules (800 mg total) by mouth 2 (two) times daily for 5 days. 03/05/22 03/10/22 Yes Lesha Jager, DO  promethazine-dextromethorphan (PROMETHAZINE-DM) 6.25-15 MG/5ML syrup Take 5 mLs by mouth 4 (four) times daily as needed. 03/05/22  Yes Skyelynn Rambeau, Seward Meth, DO  benzonatate (TESSALON) 200 MG capsule Take one cap TID PRN cough 03/05/22   Katha Cabal, DO    Family History Family History  Problem Relation Age of Onset   Hypertension Mother    Arrhythmia Mother    Hypertension Father     Social History Social History   Tobacco Use   Smoking status: Never   Smokeless tobacco: Never  Vaping Use   Vaping Use: Never used  Substance Use Topics   Alcohol use: No     Allergies   Patient has no known allergies.   Review of Systems Review of Systems: negative unless otherwise stated in HPI.      Physical Exam Triage  Vital Signs ED Triage Vitals  Enc Vitals Group     BP 03/05/22 0835 137/69     Pulse Rate 03/05/22 0835 (!) 50     Resp 03/05/22 0835 14     Temp 03/05/22 0835 99 F (37.2 C)     Temp Source 03/05/22 0835 Oral     SpO2 03/05/22 0835 97 %     Weight 03/05/22 0832 211 lb (95.7 kg)     Height 03/05/22 0832 5\' 7"  (1.702 m)     Head Circumference --      Peak Flow --      Pain Score 03/05/22 0832 4     Pain Loc --      Pain Edu? --      Excl. in GC? --    No data found.  Updated Vital Signs BP 137/69 (BP Location: Left Arm)   Pulse (!) 50   Temp 99 F (37.2 C) (Oral)   Resp 14   Ht 5\' 7"  (1.702 m)   Wt 95.7 kg   LMP 02/12/2022 (Approximate)   SpO2 97%   BMI 33.05 kg/m   Visual Acuity Right Eye Distance:   Left Eye Distance:   Bilateral Distance:    Right Eye Near:   Left Eye Near:    Bilateral Near:  Physical Exam GEN:     alert, non-toxic appearing female in no distress     HENT:  mucus membranes moist, oropharyngeal  without lesions or  exudate, no  tonsillar hypertrophy,   mild oropharyngeal erythema, clear nasal discharge,  bilateral TM  normal EYES:   pupils equal and reactive, EOMi ,  no scleral injection NECK:  normal ROM,  no meningismus   RESP:  no increased work of breathing,  clear to auscultation bilaterally CVS:   regular rate  and rhythm Skin:   warm and dry, no rash on visible skin , normal  skin turgor    UC Treatments / Results  Labs (all labs ordered are listed, but only abnormal results are displayed) Labs Reviewed  SARS CORONAVIRUS 2 BY RT PCR - Abnormal; Notable for the following components:      Result Value   SARS Coronavirus 2 by RT PCR POSITIVE (*)    All other components within normal limits    EKG   Radiology No results found.  Procedures Procedures (including critical care time)  Medications Ordered in UC Medications - No data to display  Initial Impression / Assessment and Plan / UC Course  I have reviewed the  triage vital signs and the nursing notes.  Pertinent labs & imaging results that were available during my care of the patient were reviewed by me and considered in my medical decision making (see chart for details).      COVID exposure Patient is a 56 y.o. female who presents after home COVID exposure and she is now having symptoms.  Symptoms started yesterday. She is well-hydrated and in no respiratory distress.  Discussed symptomatic treatment.  Her cough bothers her the most and we will give her Tessalon Perles, Promethazine DM and albuterol to help with chest tightness.  Pulmonary exam she has equal aeration bilaterally, imaging deferred.   Patient's COVID test returned positive.  She was called and updated on the positive result. She is  interested in treatment for COVID and is within the treatment window.. After shared decision making with her partner she is interested in [place].  Rx sent to pharmacy.  Work note offered but has not needed.  Quarantine instructions provided.  ED and return precautions and understanding voiced. Discussed MDM, treatment plan and plan for follow-up with patient/parent who agrees with plan.      Final Clinical Impressions(s) / UC Diagnoses   Final diagnoses:  Close exposure to COVID-19 virus  COVID-19     Discharge Instructions      We will contact you if your COVID test is positive.  Please quarantine while you wait for the results.  If your test is negative you may resume normal activities.   If your test for COVID-19 is positive, meaning that you were infected with the novel coronavirus and could give the germ to others.  Please continue isolation at home for at least 5 days since the start of your symptoms. Once you complete your 5 day quarantine, you may return to normal activities as long as you've not had a fever for over 24 hours(without taking fever reducing medicine) and your symptoms are improving. Be sure to wear a mask until Day 11.    Please continue good preventive care measures, including:  frequent hand-washing, avoid touching your face, cover coughs/sneezes, stay out of crowds and keep a 6 foot distance from others.  Go to the nearest hospital emergency room if fever/cough/breathlessness are severe or illness seems like  a threat to life.   You can take Tylenol and/or Ibuprofen as needed for fever reduction and pain relief.    For cough: honey 1/2 to 1 teaspoon (you can dilute the honey in water or another fluid).  You can also use guaifenesin and dextromethorphan for cough. You can use a humidifier for chest congestion and cough.  If you don't have a humidifier, you can sit in the bathroom with the hot shower running.      For sore throat: try warm salt water gargles, cepacol lozenges, throat spray, warm tea or water with lemon/honey, popsicles or ice, or OTC cold relief medicine for throat discomfort.    For congestion: take a daily anti-histamine like Zyrtec, Claritin, and a oral decongestant, such as pseudoephedrine.  You can also use Flonase 1-2 sprays in each nostril daily.    It is important to stay hydrated: drink plenty of fluids (water, gatorade/powerade/pedialyte, juices, or teas) to keep your throat moisturized and help further relieve irritation/discomfort.    Return or go to the Emergency Department if symptoms worsen or do not improve in the next few days       ED Prescriptions     Medication Sig Dispense Auth. Provider   benzonatate (TESSALON) 200 MG capsule Take one cap TID PRN cough 30 capsule Bowen Kia, DO   promethazine-dextromethorphan (PROMETHAZINE-DM) 6.25-15 MG/5ML syrup Take 5 mLs by mouth 4 (four) times daily as needed. 118 mL Kwamaine Cuppett, DO   molnupiravir EUA (LAGEVRIO) 200 mg CAPS capsule Take 4 capsules (800 mg total) by mouth 2 (two) times daily for 5 days. 40 capsule Shawnia Vizcarrondo, DO      PDMP not reviewed this encounter.   Lyndee Hensen, DO 03/05/22 1112

## 2022-03-05 NOTE — Discharge Instructions (Addendum)
We will contact you if your COVID test is positive.  Please quarantine while you wait for the results.  If your test is negative you may resume normal activities.   If your test for COVID-19 is positive, meaning that you were infected with the novel coronavirus and could give the germ to others.  Please continue isolation at home for at least 5 days since the start of your symptoms. Once you complete your 5 day quarantine, you may return to normal activities as long as you've not had a fever for over 24 hours(without taking fever reducing medicine) and your symptoms are improving. Be sure to wear a mask until Day 11.   Please continue good preventive care measures, including:  frequent hand-washing, avoid touching your face, cover coughs/sneezes, stay out of crowds and keep a 6 foot distance from others.  Go to the nearest hospital emergency room if fever/cough/breathlessness are severe or illness seems like a threat to life.   You can take Tylenol and/or Ibuprofen as needed for fever reduction and pain relief.    For cough: honey 1/2 to 1 teaspoon (you can dilute the honey in water or another fluid).  You can also use guaifenesin and dextromethorphan for cough. You can use a humidifier for chest congestion and cough.  If you don't have a humidifier, you can sit in the bathroom with the hot shower running.      For sore throat: try warm salt water gargles, cepacol lozenges, throat spray, warm tea or water with lemon/honey, popsicles or ice, or OTC cold relief medicine for throat discomfort.    For congestion: take a daily anti-histamine like Zyrtec, Claritin, and a oral decongestant, such as pseudoephedrine.  You can also use Flonase 1-2 sprays in each nostril daily.    It is important to stay hydrated: drink plenty of fluids (water, gatorade/powerade/pedialyte, juices, or teas) to keep your throat moisturized and help further relieve irritation/discomfort.    Return or go to the Emergency Department  if symptoms worsen or do not improve in the next few days

## 2022-03-05 NOTE — ED Triage Notes (Signed)
Patient states that her husband was diagnosed with COVID on Monday.  Patient states that she started having cough, runny nose and congestion that started yesterday.  Patient denies fevers.

## 2023-05-05 ENCOUNTER — Ambulatory Visit
Admission: EM | Admit: 2023-05-05 | Discharge: 2023-05-05 | Disposition: A | Discharge status: Home / Self Care | Payer: BC Managed Care – PPO | Attending: Family Medicine | Admitting: Family Medicine

## 2023-05-05 DIAGNOSIS — H10211 Acute toxic conjunctivitis, right eye: Secondary | ICD-10-CM | POA: Diagnosis not present

## 2023-05-05 DIAGNOSIS — H5712 Ocular pain, left eye: Secondary | ICD-10-CM

## 2023-05-05 NOTE — ED Triage Notes (Addendum)
Patient got bleach in her left eye today. Flushed with water. Eye is red. 20 min ago.   R:20/20 L: 20/200 B 20/20

## 2023-05-05 NOTE — ED Provider Notes (Addendum)
[facility]    CSN: [id number] Arrival date & time: 05/05/23  1305      History   Chief Complaint Chief Complaint  Patient presents with   Eye Injury    HPI HPI  April Ibarra is a 57 y.o. female.    April Ibarra presents for left eye pain that started bleach splashed in her eye while trying to clean the bathroom. Injury occurred about 20 minutes prior to arrival. She tried flushing her eye out but her brother insisted she be seen.  Has blurry vision and the eye is red. Pt unsure how much bleach splashed into her eye but her face didn't get too wet.   April Ibarra does not wear glasses or contacts.  April Ibarra has had left eye tearing since the event and eye pain remains.  April Ibarra has otherwise been well and has no additional concerns today.    Past Medical History:  Diagnosis Date   Plantar fascia syndrome     There are no active problems to display for this patient.   History reviewed. No pertinent surgical history.  OB History   No obstetric history on file.      Home Medications    Prior to Admission medications   Medication Sig Start Date End Date Taking? Authorizing Provider  benzonatate (TESSALON) 200 MG capsule Take one cap TID PRN cough 03/05/22   Jerrod Damiano, DO  promethazine-dextromethorphan (PROMETHAZINE-DM) 6.25-15 MG/5ML syrup Take 5 mLs by mouth 4 (four) times daily as needed. 03/05/22   Katha Cabal, DO    Family History Family History  Problem Relation Age of Onset   Hypertension Mother    Arrhythmia Mother    Hypertension Father     Social History Social History   Tobacco Use   Smoking status: Never   Smokeless tobacco: Never  Vaping Use   Vaping status: Never Used  Substance Use Topics   Alcohol use: No     Allergies   Patient has no known allergies.   Review of Systems Review of Systems : negative unless otherwise stated in HPI.      Physical Exam Triage Vital Signs ED Triage Vitals  Encounter  Vitals Group     BP 05/05/23 1317 138/75     Systolic BP Percentile --      Diastolic BP Percentile --      Pulse Rate 05/05/23 1317 (!) 53     Resp 05/05/23 1317 19     Temp 05/05/23 1317 98.7 F (37.1 C)     Temp Source 05/05/23 1317 Oral     SpO2 05/05/23 1317 96 %     Weight --      Height --      Head Circumference --      Peak Flow --      Pain Score 05/05/23 1316 9     Pain Loc --      Pain Education --      Exclude from Growth Chart --    No data found.  Updated Vital Signs BP 138/75 (BP Location: Right Arm)   Pulse (!) 53   Temp 98.7 F (37.1 C) (Oral)   Resp 19   LMP 04/05/2023 (Exact Date)   SpO2 96%   Visual Acuity Right Eye Distance:   Left Eye Distance:   Bilateral Distance:    Right Eye Near: R Near: 20/20 Left Eye Near:  L Near: 20/200 Bilateral Near:  20/20  Physical Exam  GEN: non-toxic appearing female,  in no acute distress  NECK: normal ROM  CV: regular rate, brisk cap refill  RESP: no increased work of breathing EYES:     General: Lids are normal. No gross foreign bodies appreciated. Gaze aligned appropriately.        Right eye: No discharge.        Left eye: No foreign body, discharge or hordeolum.     Extraocular Movements: Extraocular movements intact.     PERRLA    Conjunctiva/sclera:     Left eye: Left conjunctiva is injected. No chemosis or hemorrhage.    SKIN: warm and dry   UC Treatments / Results  Labs (all labs ordered are listed, but only abnormal results are displayed) Labs Reviewed - No data to display  EKG   Radiology No results found.  Procedures Procedures (including critical care time)  Procedures Eye Lavage   Date/Time: 04/30/2019 9:08 PM Performed by: nursing staff Authorized by: Katha Cabal, DO   Consent:    Consent obtained:  Verbal   Consent given by:  Patient   Risks discussed:  yes   Alternatives discussed: referral to ED  Procedure details:    Location:  Left eye   Procedure type:  irrigation with normal saline   Post-procedure details:    Inspection:  erythematous conjunctiva after chemical burn    Hearing quality:  Improved pH   Patient tolerance of procedure:  Tolerated well, no immediate complications   Medications Ordered in UC Medications - No data to display  Initial Impression / Assessment and Plan / UC Course  I have reviewed the triage vital signs and the nursing notes.  Pertinent labs & imaging results that were available during my care of the patient were reviewed by me and considered in my medical decision making (see chart for details).     Patient is a 57 y.o. female who presents after left eye chemical burn. Initial pH is 8.  Left eye flushed with 4 L of normal saline here.  Spoke with on-call physician for [facility] who recommended rechecking pH after flushing eye.  Discussed left eye vision discrepancy.  Dr Brooke Dare recommended evaluation at the [facility] office.  Repeat pH 7. Pt's brother will drive her to [facility] for further evaluation for her chemical conjunctivitis/chemical eye burn.   Understanding voiced.   Discussed MDM, treatment plan and plan for follow-up with patient and her brother who agree with plan.  Final Clinical Impressions(s) / UC Diagnoses   Final diagnoses:  Chemical conjunctivitis of right eye  Left eye pain     Discharge Instructions      Go to [facility] in [place]. Meet Dr Brooke Dare or one of his colleagues there.       ED Prescriptions   None    PDMP not reviewed this encounter.       Katha Cabal, DO 05/05/23 1448

## 2023-05-05 NOTE — Discharge Instructions (Addendum)
Go to [facility] in [place]. Meet Dr Brooke Dare or one of his colleagues there.

## 2024-02-21 NOTE — Progress Notes (Signed)
  Chief Complaint  Patient presents with  . Weight Issues    Subjective  April Ibarra is a 58 y.o. female who presents for Weight Issues HPI History of Present Illness A 58 year old female who presents for a refill of Wegovy and discussion about maintenance dosing.  She is in the process of moving to [place], [place] , and has been busy with selling her house and her mother's house. Her mother will be moving with her to [place] .  She has been using Wegovy for weight management. The 1 mg dose was effective and well-tolerated, with no significant side effects. However, she experienced headaches and nausea with the 1.7 mg dose. Despite these side effects, she has managed to maintain her weight, although it fluctuates slightly. She notes that she has not gained a significant amount of weight and feels better at the lower dose.  She has had difficulty attending previous appointments due to scheduling conflicts with house showings.  Review of Systems See hpi Patient Active Problem List  Diagnosis  . Premature ventricular contractions    Outpatient Medications Prior to Visit  Medication Sig Dispense Refill  . semaglutide (WEGOVY) 1.7 mg/0.75 mL pen injector Inject 0.75 mLs (1.7 mg total) subcutaneously once a week 3 mL 3   No facility-administered medications prior to visit.      Objective  Vitals:   02/21/24 1052  BP: 118/78  Pulse: 75  Weight: 89.6 kg (197 lb 9.6 oz)  PainSc: 0-No pain   Body mass index is 30.95 kg/m.  Home Vitals:     Physical Exam Physical Exam    Constitutional: alert, in NAD, and communicates well Respiratory: clear to auscultation, without rales or wheezes  Cardiovascular: regular rate and rhythm and without murmurs, rubs or gallops   Assessment/Plan:   Assessment & Plan Obesity Semaglutide 1 mg effective for weight maintenance. Higher dose caused adverse effects. - Prescribed Semaglutide 1 mg subcutaneously weekly, 39-month supply, 3  refills. Diagnoses and all orders for this visit:  Obesity (BMI 30.0-34.9) -     semaglutide (WEGOVY) 1 mg/0.5 mL pen injector; Inject 0.5 mLs (1 mg total) subcutaneously once a week    This visit was coded based on medical decision making (MDM).           Future Appointments     Date/Time Provider Department Center Visit Type   03/28/2024 11:00 AM (Arrive by 10:45 AM) Trinidad Cassondra Fell, MD [facility] PHYSICAL       There are no Patient Instructions on file for this visit.  An after visit summary was provided for the patient either in written format (printed) or through [facility].  This note has been created using automated tools and reviewed for accuracy by JONATHON COOPER HODGES.

## 2024-04-23 ENCOUNTER — Observation Stay

## 2024-04-23 ENCOUNTER — Emergency Department

## 2024-04-23 ENCOUNTER — Observation Stay
Admission: EM | Admit: 2024-04-23 | Discharge: 2024-04-24 | Disposition: A | Attending: Internal Medicine | Admitting: Internal Medicine

## 2024-04-23 ENCOUNTER — Other Ambulatory Visit: Payer: Self-pay

## 2024-04-23 DIAGNOSIS — R42 Dizziness and giddiness: Secondary | ICD-10-CM | POA: Diagnosis present

## 2024-04-23 DIAGNOSIS — Z6830 Body mass index (BMI) 30.0-30.9, adult: Secondary | ICD-10-CM | POA: Diagnosis not present

## 2024-04-23 DIAGNOSIS — I498 Other specified cardiac arrhythmias: Secondary | ICD-10-CM | POA: Diagnosis not present

## 2024-04-23 DIAGNOSIS — R9431 Abnormal electrocardiogram [ECG] [EKG]: Secondary | ICD-10-CM

## 2024-04-23 DIAGNOSIS — I5022 Chronic systolic (congestive) heart failure: Secondary | ICD-10-CM | POA: Diagnosis present

## 2024-04-23 DIAGNOSIS — E66811 Obesity, class 1: Secondary | ICD-10-CM | POA: Diagnosis present

## 2024-04-23 DIAGNOSIS — I493 Ventricular premature depolarization: Secondary | ICD-10-CM | POA: Diagnosis present

## 2024-04-23 HISTORY — DX: Chronic systolic (congestive) heart failure: I50.22

## 2024-04-23 HISTORY — DX: Obesity, class 1: E66.811

## 2024-04-23 HISTORY — DX: Ventricular premature depolarization: I49.3

## 2024-04-23 LAB — TROPONIN T, HIGH SENSITIVITY: Troponin T High Sensitivity: <15 ng/L (ref 0–19)

## 2024-04-23 LAB — COMPREHENSIVE METABOLIC PANEL WITH GFR
ALT: 18 U/L (ref 0–44)
AST: 14 U/L — ABNORMAL LOW (ref 15–41)
Albumin: 4 g/dL (ref 3.5–5.0)
Alkaline Phosphatase: 67 U/L (ref 38–126)
Anion gap: 12 (ref 5–15)
BUN: 21 mg/dL — ABNORMAL HIGH (ref 6–20)
CO2: 25 mmol/L (ref 22–32)
Calcium: 8.4 mg/dL — ABNORMAL LOW (ref 8.9–10.3)
Chloride: 101 mmol/L (ref 98–111)
Creatinine, Ser: 0.83 mg/dL (ref 0.44–1.00)
GFR, Estimated: >60 mL/min (ref >60)
Glucose, Bld: 136 mg/dL — ABNORMAL HIGH (ref 70–99)
Potassium: 3.7 mmol/L (ref 3.5–5.1)
Sodium: 138 mmol/L (ref 135–145)
Total Bilirubin: 0.3 mg/dL (ref 0.0–1.2)
Total Protein: 6.6 g/dL (ref 6.5–8.1)

## 2024-04-23 LAB — URINALYSIS, ROUTINE W REFLEX MICROSCOPIC
Bilirubin Urine: NEGATIVE
Glucose, UA: NEGATIVE mg/dL
Hgb urine dipstick: NEGATIVE
Ketones, ur: NEGATIVE mg/dL
Leukocytes,Ua: NEGATIVE
Nitrite: NEGATIVE
Protein, ur: NEGATIVE mg/dL
Specific Gravity, Urine: 1.016 (ref 1.005–1.030)
pH: 8 (ref 5.0–8.0)

## 2024-04-23 LAB — CBC WITH DIFFERENTIAL/PLATELET
Abs Immature Granulocytes: 0.05 K/uL (ref 0.00–0.07)
Basophils Absolute: 0.1 K/uL (ref 0.0–0.1)
Basophils Relative: 1 %
Eosinophils Absolute: 0.2 K/uL (ref 0.0–0.5)
Eosinophils Relative: 2 %
HCT: 38.6 % (ref 36.0–46.0)
Hemoglobin: 13 g/dL (ref 12.0–15.0)
Immature Granulocytes: 1 %
Lymphocytes Relative: 18 %
Lymphs Abs: 1.8 K/uL (ref 0.7–4.0)
MCH: 30.2 pg (ref 26.0–34.0)
MCHC: 33.7 g/dL (ref 30.0–36.0)
MCV: 89.6 fL (ref 80.0–100.0)
Monocytes Absolute: 0.7 K/uL (ref 0.1–1.0)
Monocytes Relative: 7 %
Neutro Abs: 7.3 K/uL (ref 1.7–7.7)
Neutrophils Relative %: 71 %
Platelets: 217 K/uL (ref 150–400)
RBC: 4.31 MIL/uL (ref 3.87–5.11)
RDW: 13.8 % (ref 11.5–15.5)
WBC: 10.2 K/uL (ref 4.0–10.5)
nRBC: 0 % (ref 0.0–0.2)

## 2024-04-23 LAB — CBG MONITORING, ED: Glucose-Capillary: 130 mg/dL — ABNORMAL HIGH (ref 70–99)

## 2024-04-23 LAB — APTT: aPTT: 27 s (ref 24–36)

## 2024-04-23 LAB — PROTIME-INR
INR: 1.1 (ref 0.8–1.2)
Prothrombin Time: 15.2 s (ref 11.4–15.2)

## 2024-04-23 LAB — PRO BRAIN NATRIURETIC PEPTIDE: Pro Brain Natriuretic Peptide: 66.4 pg/mL (ref <300.0)

## 2024-04-23 MED ORDER — MECLIZINE HCL 25 MG PO TABS
25.0000 mg | ORAL_TABLET | Freq: Once | ORAL | Status: AC
  Administered 2024-04-23: 25 mg via ORAL
  Filled 2024-04-23: qty 1

## 2024-04-23 MED ORDER — SODIUM CHLORIDE 0.9 % IV SOLN: INTRAVENOUS | Status: DC

## 2024-04-23 MED ORDER — STROKE: EARLY STAGES OF RECOVERY BOOK: Freq: Once | Status: AC

## 2024-04-23 MED ORDER — ONDANSETRON HCL 4 MG/2ML IJ SOLN: 4.0000 mg | Freq: Three times a day (TID) | INTRAMUSCULAR | Status: DC | PRN

## 2024-04-23 MED ORDER — ATORVASTATIN CALCIUM 20 MG PO TABS
40.0000 mg | ORAL_TABLET | Freq: Every day | ORAL | Status: DC
  Administered 2024-04-23: 40 mg via ORAL
  Filled 2024-04-23 (×2): qty 2

## 2024-04-23 MED ORDER — ACETAMINOPHEN 650 MG RE SUPP: 650.0000 mg | RECTAL | Status: DC | PRN

## 2024-04-23 MED ORDER — SODIUM CHLORIDE 0.9 % IV BOLUS
1000.0000 mL | Freq: Once | INTRAVENOUS | Status: AC
  Administered 2024-04-23: 1000 mL via INTRAVENOUS

## 2024-04-23 MED ORDER — ACETAMINOPHEN 160 MG/5ML PO SOLN: 650.0000 mg | ORAL | Status: DC | PRN

## 2024-04-23 MED ORDER — ASPIRIN 300 MG RE SUPP: 300.0000 mg | Freq: Every day | RECTAL | Status: DC

## 2024-04-23 MED ORDER — SENNOSIDES-DOCUSATE SODIUM 8.6-50 MG PO TABS: 1.0000 | ORAL_TABLET | Freq: Every evening | ORAL | Status: DC | PRN

## 2024-04-23 MED ORDER — ACETAMINOPHEN 325 MG PO TABS: 650.0000 mg | ORAL_TABLET | ORAL | Status: DC | PRN

## 2024-04-23 MED ORDER — ENOXAPARIN SODIUM 60 MG/0.6ML IJ SOSY
45.0000 mg | PREFILLED_SYRINGE | INTRAMUSCULAR | Status: DC
  Filled 2024-04-23: qty 0.6

## 2024-04-23 MED ORDER — HYDRALAZINE HCL 20 MG/ML IJ SOLN: 5.0000 mg | INTRAMUSCULAR | Status: DC | PRN

## 2024-04-23 MED ORDER — ASPIRIN 325 MG PO TABS
325.0000 mg | ORAL_TABLET | Freq: Every day | ORAL | Status: DC
  Filled 2024-04-23: qty 1

## 2024-04-23 MED ORDER — MECLIZINE HCL 25 MG PO TABS: 25.0000 mg | ORAL_TABLET | Freq: Three times a day (TID) | ORAL | Status: DC | PRN

## 2024-04-23 MED ORDER — IOHEXOL 350 MG/ML SOLN
75.0000 mL | Freq: Once | INTRAVENOUS | Status: AC | PRN
  Administered 2024-04-23: 75 mL via INTRAVENOUS

## 2024-04-23 NOTE — ED Triage Notes (Signed)
 Pt comes in via [facility] with a sudden onset of dizziness and nausea that started about 30 minutes ago. Pt with no complaints of pain at this time.

## 2024-04-23 NOTE — ED Notes (Signed)
 Called [facility] to initiate Code Stroke @ 2000 spoke to Rep: Tammy who gathered information to activate page

## 2024-04-23 NOTE — ED Triage Notes (Signed)
 Pt comes with c/o dizziness via EMS from home.pt given 4 zofran  18 in lac.  VSS

## 2024-04-23 NOTE — ED Provider Notes (Signed)
 [facility] Provider Note    Event Date/Time   First MD Initiated Contact with Patient 04/23/24 1957     (approximate)   History   Dizziness and Nausea   HPI  April Ibarra is a 58 y.o. female with no significant past medical history except PACs who presents of 30 minutes of vertigo.  She feels as if the room is wavy and spinning.  Denies any falls.  Denies any history of previous presentations.  Has no extremity or facial weakness or sensation changes.  Denies any new medications.  Denies any chest pain shortness of breath or abdominal pain.  She presents with her significant other who contributes to the history      Physical Exam   Triage Vital Signs: ED Triage Vitals  Encounter Vitals Group     BP 04/23/24 1842 (!) 147/64     Girls Systolic BP Percentile --      Girls Diastolic BP Percentile --      Boys Systolic BP Percentile --      Boys Diastolic BP Percentile --      Pulse Rate 04/23/24 1842 96     Resp 04/23/24 1842 17     Temp 04/23/24 1842 98.4 F (36.9 C)     Temp src --      SpO2 04/23/24 1842 100 %     Weight 04/23/24 1844 197 lb (89.4 kg)     Height 04/23/24 1844 5' 7 (1.702 m)     Head Circumference --      Peak Flow --      Pain Score 04/23/24 1842 0     Pain Loc --      Pain Education --      Exclude from Growth Chart --     Most recent vital signs: Vitals:   04/23/24 2045 04/23/24 2230  BP:  (!) 147/65  Pulse: 83   Resp: 11 11  Temp:    SpO2: 100% 95%    Nursing Triage Note reviewed. Vital signs reviewed and patients oxygen saturation is normoxic  General: Patient is well nourished, well developed, awake and alert, appears unwell,  Head: Normocephalic and atraumatic Eyes: Normal inspection, extraocular muscles intact, no conjunctival pallor Ear, nose, throat: Normal external exam Neck: Normal range of motion Respiratory: Patient is in no respiratory distress, lungs CTAB Cardiovascular: Patient is not  tachycardic, RRR without murmur appreciated GI: Abd SNT with no guarding or rebound  Back: Normal inspection of the back with good strength and range of motion throughout all ext Extremities: pulses intact with good cap refills, no LE pitting edema or calf tenderness Neuro: The patient is alert and oriented to person, place, and time, appropriately conversive, with 5/5 bilat UE/LE strength, no gross motor or sensory defects noted.  Ambulates with ataxia, nystagmus bilaterally Skin: Warm, dry, and intact Psych: normal mood and affect, no SI or HI  ED Results / Procedures / Treatments   Labs (all labs ordered are listed, but only abnormal results are displayed) Labs Reviewed  COMPREHENSIVE METABOLIC PANEL WITH GFR - Abnormal; Notable for the following components:      Result Value   Glucose, Bld 136 (*)    BUN 21 (*)    Calcium  8.4 (*)    AST 14 (*)    All other components within normal limits  CBG MONITORING, ED - Abnormal; Notable for the following components:   Glucose-Capillary 130 (*)    All other components within  normal limits  CBC WITH DIFFERENTIAL/PLATELET  PROTIME-INR  APTT  URINALYSIS, ROUTINE W REFLEX MICROSCOPIC  URINE DRUG SCREEN  MAGNESIUM  TSH  PRO BRAIN NATRIURETIC PEPTIDE  HIV ANTIBODY (ROUTINE TESTING W REFLEX)  LIPID PANEL  HEMOGLOBIN A1C  TROPONIN T, HIGH SENSITIVITY     EKG EKG and rhythm strip are interpreted by myself:   EKG: [Normal sinus rhythm] at heart rate of 84, normal QRS duration, QTc 453, nonspecific ST segments and T waves PVCs EKG not consistent with Acute STEMI Rhythm strip: Bigeminy in lead II   RADIOLOGY CT head: No intracranial hemorrhage on my independent review interpretation radiologist agrees CT angio head and neck: No large vessel occlusion    PROCEDURES:  Critical Care performed: Yes, see critical care procedure note(s)  .Critical Care  Performed by: Nicholaus Rolland BRAVO, MD Authorized by: Nicholaus Rolland BRAVO, MD    Critical care provider statement:    Critical care time (minutes):  30   Critical care was necessary to treat or prevent imminent or life-threatening deterioration of the following conditions:  CNS failure or compromise   Critical care was time spent personally by me on the following activities:  Development of treatment plan with patient or surrogate, discussions with consultants, evaluation of patient's response to treatment, examination of patient, ordering and review of laboratory studies, ordering and review of radiographic studies, ordering and performing treatments and interventions, pulse oximetry, re-evaluation of patient's condition and review of old charts   Care discussed with: admitting provider   Comments:     Consideration of acute CVA, made a stroke alert with immediate assessment and discussion with neurologist and consideration or not given TNK    MEDICATIONS ORDERED IN ED: Medications  ondansetron  (ZOFRAN ) injection 4 mg (has no administration in time range)  hydrALAZINE  (APRESOLINE ) injection 5 mg (has no administration in time range)   stroke: early stages of recovery book (has no administration in time range)  acetaminophen  (TYLENOL ) tablet 650 mg (has no administration in time range)    Or  acetaminophen  (TYLENOL ) 160 MG/5ML solution 650 mg (has no administration in time range)    Or  acetaminophen  (TYLENOL ) suppository 650 mg (has no administration in time range)  senna-docusate (Senokot-S) tablet 1 tablet (has no administration in time range)  enoxaparin  (LOVENOX ) injection 45 mg (has no administration in time range)  aspirin  suppository 300 mg (has no administration in time range)    Or  aspirin  tablet 325 mg (has no administration in time range)  atorvastatin  (LIPITOR) tablet 40 mg (has no administration in time range)  meclizine  (ANTIVERT ) tablet 25 mg (has no administration in time range)  iohexol  (OMNIPAQUE ) 350 MG/ML injection 75 mL (75 mLs Intravenous  Contrast Given 04/23/24 2036)  sodium chloride  0.9 % bolus 1,000 mL (1,000 mLs Intravenous New Bag/Given 04/23/24 2221)  meclizine  (ANTIVERT ) tablet 25 mg (25 mg Oral Given 04/23/24 2223)     IMPRESSION / MDM / ASSESSMENT AND PLAN / ED COURSE                                Differential diagnosis includes, but is not limited to, CVA, intracranial hemorrhage, vestibular neuritis, Mnire's disease, electrolyte derangement anemia, arrhythmia   ED course: Patient presents and does appear ataxic on my initial exam.  Given her lack of previous history of vertigo, decision made to make this a stroke alert as she is within the window for TNK.  Glucose was greater then 130.  Patient was brought to the CT scanner and she had no intracranial hemorrhage.  Stroke neurologist on iPad and NIH score of 0 when they got to their assessment.  They do recommend admission for MRI and monitoring.  Patient does have an abnormal EKG but thyroid tests are unremarkable.  Will discuss admission with hospitalist   Clinical Course as of 04/23/24 2326  Mon Apr 23, 2024  2020 Glucose-Capillary(!): 130 [HD]  2020 Troponin T High Sensitivity: <15 [HD]    Clinical Course User Index [HD] Nicholaus Rolland BRAVO, MD   -- Risk: 5 This patient has a high risk of morbidity due to further diagnostic testing or treatment. Rationale: This patient's evaluation and management involve a high risk of morbidity due to the potential severity of presenting symptoms, need for diagnostic testing, and/or initiation of treatment that may require close monitoring. The differential includes conditions with potential for significant deterioration or requiring escalation of care. Treatment decisions in the ED, including medication administration, procedural interventions, or disposition planning, reflect this level of risk. COPA: 5 The patient has the following acute or chronic illness/injury that poses a possible threat to life or bodily function: [X] :  The patient has a potentially serious acute condition or an acute exacerbation of a chronic illness requiring urgent evaluation and management in the Emergency Department. The clinical presentation necessitates immediate consideration of life-threatening or function-threatening diagnoses, even if they are ultimately ruled out.   FINAL CLINICAL IMPRESSION(S) / ED DIAGNOSES   Final diagnoses:  Vertigo  Abnormal EKG  Bigeminy     Rx / DC Orders   ED Discharge Orders     None        Note:  This document was prepared using Dragon voice recognition software and may include unintentional dictation errors.   Nicholaus Rolland BRAVO, MD 04/23/24 [id number]

## 2024-04-23 NOTE — Consult Note (Signed)
 [facility]   Patient Name:   April Ibarra, April Ibarra Date of Birth:   Apr 04, 1966 Identification Number:   MRN - [id number] Date of Service:   04/23/2024 20:09:01  Diagnosis:       H82 - Vertiginous syndromes in diseases classified  Impression:      This is a 58 year old woman presenting with intermittent vertigo. NIH stroke scale is 0. She was able to walk independently. Lysis is contraindicated given nondisabling picture and excellent prognosis for stroke associated isolated vertigo. A peripheral etiology may be relevant as well of course    Recommend:  1. CT angiogram of the head and neck are without acute pathology  2. MRI of the brain without contrast, transthoracic echo with bubbles, lipids, A1c and telemetry  3. Bedside swallow evaluation and speech therapy for clearance as needed  4. If she passes her swallow evaluation we will start her on aspirin  325 mg daily now and if she fails a swallow evaluation we can start aspirin  300 mg per rectum starting now  5. Symptomatic vertigo treatment and vestibular therapy        Stroke/Telemetry Floor       Neuro Checks (Q4)       Bedside Swallow Eval       DVT Prophylaxis       IV Fluids, Normal Saline       Head of Bed 30 Degrees       Euglycemia and Avoid Hyperthermia (PRN Acetaminophen )    ------------------------------------------------------------------------------  Advanced Imaging: CTA Head and Neck Completed.  LVO:No  Patient is not a candidate for NIR   Metrics: Last Known Well: 04/23/2024 18:00:00 Arrival Time: 04/23/2024 18:33:00 Activation Time: 04/23/2024 20:09:01 Initial Response Time: 04/23/2024 79:86:75 Symptoms: dizziness nausea. Initial patient interaction: 04/23/2024 20:14:32 NIHSS Assessment Completed: 04/23/2024 20:18:00 Patient is not a candidate for Thrombolytic. Thrombolytic Medical Decision: 04/23/2024 20:18:00 Patient was not deemed candidate for  Thrombolytic because of following reasons: Stroke severity too mild (non-disabling) .  CT Head: I personally reviewed all the CT images that were available to me and it showed: no acute pathology  Primary Provider Notified of Diagnostic Impression and Management Plan on: 04/23/2024 20:31:33    ------------------------------------------------------------------------------  History of Present Illness: Patient is a 58 year old Female.  Patient was brought by EMS for symptoms of dizziness nausea. 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 She went to take a shower when this started. It is intermittent. She takes no blood thinners.    Past Medical History: Other PMH:  PVCs  Medications:  No Anticoagulant use  No Antiplatelet use Reviewed EMR for current medications  Allergies:  Reviewed  Social History: Smoking: No Alcohol Use: No Drug Use: No  Family History:  There is no family history of premature cerebrovascular disease pertinent to this consultation  ROS : 14 Points Review of Systems was performed and was negative except mentioned in HPI.  Past Surgical History: There Is No Surgical History Contributory To Today's Visit     Examination: BP(145/89), Pulse(91), Blood Glucose(130) 1A: Level of Consciousness - Alert; keenly responsive + 0 1B: Ask Month and Age - Both Questions Right + 0 1C: Blink Eyes & Squeeze Hands - Performs Both Tasks + 0 2: Test Horizontal Extraocular Movements - Normal + 0 3: Test Visual Fields - No Visual Loss + 0 4: Test Facial Palsy (Use  Grimace if Obtunded) - Normal symmetry + 0 5A: Test Left Arm Motor Drift - No Drift for 10 Seconds + 0 5B: Test Right Arm Motor Drift - No Drift for 10 Seconds + 0 6A: Test Left Leg Motor Drift - No Drift for 5 Seconds + 0 6B: Test Right Leg Motor Drift - No Drift for 5 Seconds + 0 7:  Test Limb Ataxia (FNF/Heel-Shin) - No Ataxia + 0 8: Test Sensation - Normal; No sensory loss + 0 9: Test Language/Aphasia - Normal; No aphasia + 0 10: Test Dysarthria - Normal + 0 11: Test Extinction/Inattention - No abnormality + 0  NIHSS Score: 0  NIHSS Free Text : able to walk stand by  Pre-Morbid Modified Rankin Scale: 0 Points = No symptoms at all  Spoke with : Dr Nicholaus I reviewed the available imaging via [facility] and initiated discussion with the primary provider  This consult was conducted in real time using interactive audio and video technology. Patient was informed of the technology being used for this visit and agreed to proceed. Patient located in hospital and provider located at home/office setting.   Patient is being evaluated for possible acute neurologic impairment and high probability of imminent or life-threatening deterioration. I spent total of 25 minutes providing care to this patient, including time for face to face visit via telemedicine, review of medical records, imaging studies and discussion of findings with providers, the patient and/or family.    Dr Vicenta Hunter   [facility] For Inpatient follow-up with [facility] physician please call [facility] at [phone]. As we are not an outpatient service for any post hospital discharge needs please contact the hospital for assistance. If you have any questions for the [facility] physicians or need to reconsult for clinical or diagnostic changes please contact us  via [facility] at [phone].  Non-radiologist review of imaging performed to assist with emergent clinical decision-making. Remote physician workstations do not possess the same resolution, calibration, or diagnostic capabilities as hospital-based radiology reading stations, and formal radiologist read is necessary.   Signature : Vicenta Hunter

## 2024-04-23 NOTE — H&P (Signed)
 History and Physical    April Ibarra FMW:[id number] DOB: [birth year] DOA: 04/23/2024  Referring MD/NP/PA:   PCP: Trinidad Cassondra BROCKS, MD   Patient coming from:  The patient is coming from home.     Chief Complaint: dizziness  HPI: April Ibarra is a 58 y.o. female with medical history significant of sCHF with EF 45-50%, PVC and obesity, who presents with dizziness.  Patient states that she suddenly started feeling dizzy around 6 PM, with feeding room spinning around her.  Dizziness is aggravated by looking down.  Associate with nausea, no vomiting, diarrhea or abdominal pain.  No hearing loss or ear ringing.  She had bilateral leg tingling and weakness which has resolved at arrival to ED.  No vision loss or hearing loss.  No chest pain, cough, SOB.  No symptoms of UTI.  No fever or chills.  Code stroke was activated, teleneurology, Dr. Lorre recommended stroke workup.  Data reviewed independently and ED Course: pt was found to have WBC 10.2, GFR> 60, INR 1.1, PTT 27, troponin<15.  Temperature normal, blood pressure 147/64, heart rate 96, RR 17, oxygen saturation 100% on room air.  CT of head negative for acute intracranial abnormalities.  CTA of head and neck negative for LVO.  Patient is placed in telemetry bed for placement.   EKG: I have personally reviewed.  Sinus rhythm, QTc 453, bigeminy, poor R wave progression, LAD.   Review of Systems:   General: no fevers, chills, no body weight gain, has fatigue HEENT: no blurry vision, hearing changes or sore throat Respiratory: no dyspnea, coughing, wheezing CV: no chest pain, no palpitations GI: has nausea, no vomiting, abdominal pain, diarrhea, constipation GU: no dysuria, burning on urination, increased urinary frequency, hematuria  Ext: no leg edema Neuro:  no vision change or hearing loss. Has dizziness, bilateral leg weakness and tingling Skin: no rash, no skin tear. MSK: No muscle spasm, no deformity, no limitation  of range of movement in spin Heme: No easy bruising.  Travel history: No recent long distant travel.   Allergy: No Known Allergies  Past Medical History:  Diagnosis Date   Chronic systolic CHF (congestive heart failure) (HCC)    Obesity (BMI 30.0-34.9)    Plantar fascia syndrome    PVC (premature ventricular contraction)     Past Surgical History:  Procedure Laterality Date   ABLATION ON ENDOMETRIOSIS      Social History:  reports that she has never smoked. She has never used smokeless tobacco. She reports that she does not currently use alcohol. She reports that she does not use drugs.  Family History:  Family History  Problem Relation Age of Onset   Hypertension Mother    Arrhythmia Mother    Hypertension Father      Prior to Admission medications   Medication Sig Start Date End Date Taking? Authorizing Provider  benzonatate  (TESSALON ) 200 MG capsule Take one cap TID PRN cough 03/05/22   Brimage, Vondra, DO  promethazine -dextromethorphan (PROMETHAZINE -DM) 6.25-15 MG/5ML syrup Take 5 mLs by mouth 4 (four) times daily as needed. 03/05/22   Kriste Berth, DO    Physical Exam: Vitals:   04/23/24 1842 04/23/24 1844 04/23/24 2045 04/23/24 2230  BP: (!) 147/64   (!) 147/65  Pulse: 96  83   Resp: 17  11 11   Temp: 98.4 F (36.9 C)     SpO2: 100%  100% 95%  Weight:  89.4 kg    Height:  5' 7 (1.702 m)  General: Not in acute distress HEENT:       Eyes: PERRL, EOMI, no jaundice       ENT: No discharge from the ears and nose, no pharynx injection, no tonsillar enlargement.        Neck: No JVD, no bruit, no mass felt. Heme: No neck lymph node enlargement. Cardiac: S1/S2, RRR, No murmurs, No gallops or rubs. Respiratory: No rales, wheezing, rhonchi or rubs. GI: Soft, nondistended, nontender, no rebound pain, no organomegaly, BS present. GU: No hematuria Ext: No pitting leg edema bilaterally. 1+DP/PT pulse bilaterally. Musculoskeletal: No joint deformities, No joint  redness or warmth, no limitation of ROM in spin. Skin: No rashes.  Neuro: Alert, oriented X3, cranial nerves II-XII grossly intact, moves all extremities normally. Muscle strength 5/5 in all extremities, sensation to light touch intact.  Psych: Patient is not psychotic, no suicidal or hemocidal ideation.  Labs on Admission: I have personally reviewed following labs and imaging studies  CBC: Recent Labs  Lab 04/23/24 1846  WBC 10.2  NEUTROABS 7.3  HGB 13.0  HCT 38.6  MCV 89.6  PLT 217   Basic Metabolic Panel: Recent Labs  Lab 04/23/24 1846  NA 138  K 3.7  CL 101  CO2 25  GLUCOSE 136*  BUN 21*  CREATININE 0.83  CALCIUM  8.4*   GFR: Estimated Creatinine Clearance: 86.9 mL/min (by C-G formula based on SCr of 0.83 mg/dL). Liver Function Tests: Recent Labs  Lab 04/23/24 1846  AST 14*  ALT 18  ALKPHOS 67  BILITOT 0.3  PROT 6.6  ALBUMIN 4.0   No results for input(s): LIPASE, AMYLASE in the last 168 hours. No results for input(s): AMMONIA in the last 168 hours. Coagulation Profile: Recent Labs  Lab 04/23/24 2001  INR 1.1   Cardiac Enzymes: No results for input(s): CKTOTAL, CKMB, CKMBINDEX, TROPONINI in the last 168 hours. BNP (last 3 results) No results for input(s): PROBNP in the last 8760 hours. HbA1C: No results for input(s): HGBA1C in the last 72 hours. CBG: Recent Labs  Lab 04/23/24 2017  GLUCAP 130*   Lipid Profile: No results for input(s): CHOL, HDL, LDLCALC, TRIG, CHOLHDL, LDLDIRECT in the last 72 hours. Thyroid Function Tests: No results for input(s): TSH, T4TOTAL, FREET4, T3FREE, THYROIDAB in the last 72 hours. Anemia Panel: No results for input(s): VITAMINB12, FOLATE, FERRITIN, TIBC, IRON, RETICCTPCT in the last 72 hours. Urine analysis: No results found for: COLORURINE, APPEARANCEUR, LABSPEC, PHURINE, GLUCOSEU, HGBUR, BILIRUBINUR, KETONESUR, PROTEINUR, UROBILINOGEN,  NITRITE, LEUKOCYTESUR Sepsis Labs: @LABRCNTIP (procalcitonin:4,lacticidven:4) )No results found for this or any previous visit (from the past 240 hours).   Radiological Exams on Admission:   Assessment/Plan Principal Problem:   Dizziness Active Problems:   Chronic systolic CHF (congestive heart failure) (HCC)   PVC (premature ventricular contraction)   Obesity (BMI 30.0-34.9)   Assessment and Plan:  Dizziness: Patient presents with dizziness/vertigo with feeling of room spinning. DD would include both central and peripheral vertigo. Central vertigo needs to r/o stroke from basilar circulation system. DD for peripheral would include Mnire's disease, benign paroxysmal positional vertigo and vestibular neuritis.  CT head negative for acute intracranial abnormalities.  CTA of head and neck negative for LVO.  Dr. Lorre of teleneurology recommended stroke workup.  -Placed on tele bed for observation - Orthostatic vital signs - IV fluid: 1 L normal saline - Obtain MRI-brain  - will hold oral Bp meds to allow permissive HTN, prn IV hydralazine  for SBP>220 or dBP>120 - ASA: aspirin  325 mg daily now  and if fails swallow evaluation, will start aspirin  300 mg per rectum - Statin: lipitor 40 mg daily - fasting lipid panel and HbA1c  - 2D echo with bubble - swallowing screen. If fails, will get SLP - Check UDS  - PT/OT consult - prn meclizine  25 mg 3 times daily  Chronic systolic CHF (congestive heart failure) (HCC): 2D echo on 02/11/2016 showed EF of 45-50%.  Patient does not have leg edema, no SOB.  CHF seem to be compensated. -check pro-BNP  Hx of PVC (premature ventricular contraction): now has bigeminy.  Heart rate 90s - Follow-up TSH which was ordered by EDP - check Mg level  Obesity (BMI 30.0-34.9): Patient has Obesity Class I, with body weight 89.4 Kg and BMI 30.85 kg/m2.  - Encourage losing weight - Exercise and healthy diet        DVT ppx: SQ Lovenox   Code  Status: Full code   Family Communication:  Yes, patient's husband at bed side.  Disposition Plan:  Anticipate discharge back to previous environment  Consults called: Teleneurology, Dr. Lorre  Admission status and Level of care: Telemetry:    for obs         Dispo: The patient is from: Home              Anticipated d/c is to: Home              Anticipated d/c date is: 1 day              Patient currently is not medically stable to d/c.    Severity of Illness:  The appropriate patient status for this patient is OBSERVATION. Observation status is judged to be reasonable and necessary in order to provide the required intensity of service to ensure the patient's safety. The patient's presenting symptoms, physical exam findings, and initial radiographic and laboratory data in the context of their medical condition is felt to place them at decreased risk for further clinical deterioration. Furthermore, it is anticipated that the patient will be medically stable for discharge from the hospital within 2 midnights of admission.        Date of Service 04/23/2024    Caleb Exon [facility]   If 7PM-7AM, please contact night-coverage [email or web address] 04/23/2024, 11:41 PM

## 2024-04-24 ENCOUNTER — Encounter: Payer: Self-pay | Admitting: Internal Medicine

## 2024-04-24 ENCOUNTER — Other Ambulatory Visit: Payer: Self-pay

## 2024-04-24 ENCOUNTER — Observation Stay: Admit: 2024-04-24 | Discharge: 2024-04-24 | Disposition: A | Attending: Internal Medicine | Admitting: Internal Medicine

## 2024-04-24 DIAGNOSIS — R42 Dizziness and giddiness: Secondary | ICD-10-CM | POA: Diagnosis not present

## 2024-04-24 LAB — HEMOGLOBIN A1C
Hgb A1c MFr Bld: 5 % (ref 4.8–5.6)
Mean Plasma Glucose: 96.8 mg/dL

## 2024-04-24 LAB — ECHOCARDIOGRAM COMPLETE BUBBLE STUDY
AR max vel: 1.95 cm2
AV Area VTI: 1.59 cm2
AV Area mean vel: 1.88 cm2
AV Mean grad: 5 mmHg
AV Peak grad: 11 mmHg
Ao pk vel: 1.66 m/s
Area-P 1/2: 3.65 cm2
Calc EF: 41.2 %
MV VTI: 1.51 cm2
S' Lateral: 3.8 cm
Single Plane A2C EF: 32.7 %
Single Plane A4C EF: 49.4 %

## 2024-04-24 LAB — URINE DRUG SCREEN
Amphetamines: NEGATIVE
Barbiturates: NEGATIVE
Benzodiazepines: NEGATIVE
Cocaine: NEGATIVE
Fentanyl: NEGATIVE
Methadone Scn, Ur: NEGATIVE
Opiates: NEGATIVE
Tetrahydrocannabinol: NEGATIVE

## 2024-04-24 LAB — LIPID PANEL
Cholesterol: 172 mg/dL (ref 0–200)
HDL: 57 mg/dL (ref >40)
LDL Cholesterol: 98 mg/dL (ref 0–99)
Total CHOL/HDL Ratio: 3 ratio
Triglycerides: 83 mg/dL (ref <150)
VLDL: 17 mg/dL (ref 0–40)

## 2024-04-24 LAB — MAGNESIUM: Magnesium: 2 mg/dL (ref 1.7–2.4)

## 2024-04-24 LAB — TSH: TSH: 2.21 u[IU]/mL (ref 0.350–4.500)

## 2024-04-24 MED ORDER — PERFLUTREN LIPID MICROSPHERE
1.0000 mL | INTRAVENOUS | Status: AC | PRN
  Administered 2024-04-24: 2 mL via INTRAVENOUS

## 2024-04-24 MED ORDER — ASPIRIN 81 MG PO TBEC: 81.0000 mg | DELAYED_RELEASE_TABLET | Freq: Every day | ORAL | 0 refills | Status: AC

## 2024-04-24 MED ORDER — ASPIRIN 81 MG PO TBEC
81.0000 mg | DELAYED_RELEASE_TABLET | Freq: Every day | ORAL | Status: DC
  Administered 2024-04-24: 81 mg via ORAL
  Filled 2024-04-24: qty 1

## 2024-04-24 MED ORDER — MECLIZINE HCL 25 MG PO TABS: 25.0000 mg | ORAL_TABLET | Freq: Three times a day (TID) | ORAL | 0 refills | Status: AC | PRN

## 2024-04-24 NOTE — Discharge Summary (Signed)
 Physician Discharge Summary   Patient: April Ibarra MRN: [id number] DOB: 1965-10-21  Admit date:     04/23/2024  Discharge date: 04/24/24  Discharge Physician: Deliliah Room   PCP: April Cassondra BROCKS, MD   Recommendations at discharge:    F/u with your PCP in one week F/u with ENT in 1-2 weeks Continue taking meds as prescribed  Discharge Diagnoses: Principal Problem:   Dizziness Active Problems:   Chronic systolic CHF (congestive heart failure) (HCC)   PVC (premature ventricular contraction)   Obesity (BMI 30.0-34.9)  Hospital Course: 58 y.o. female with medical history significant of sCHF with EF 45-50%, PVC and obesity, who presented with dizziness. Code stroke was activated, teleneurology, Dr. Lorre recommended stroke workup. CT of head negative for acute intracranial abnormalities.  CTA of head and neck negative for LVO.  MRI brain didn't show any acute abnormalities. TTE with bubble study didn't show any significant abnormalities.  Likely BPPV. Discussed with neurology and patient was advised to follow up with ENT in 1-2 weeks Prescriptions given for Aspirin  81 mg daily as well as meclizine  25 mg PO TIDPRN for dizziness/vertigo.  Symptoms had resolved and patient had a steady gait on the day of the discharge.       Consultants: Neurology Procedures performed: None  Disposition: Home Diet recommendation:  Cardiac diet DISCHARGE MEDICATION: Allergies as of 04/24/2024   No Known Allergies      Medication List     TAKE these medications    aspirin  EC 81 MG tablet Take 1 tablet (81 mg total) by mouth daily. Swallow whole. Start taking on: April 25, 2024   benzonatate  200 MG capsule Commonly known as: TESSALON  Take one cap TID PRN cough   meclizine  25 MG tablet Commonly known as: ANTIVERT  Take 1 tablet (25 mg total) by mouth 3 (three) times daily as needed for dizziness.   multivitamin with minerals Tabs tablet Take 1 tablet by mouth daily.    promethazine -dextromethorphan 6.25-15 MG/5ML syrup Commonly known as: PROMETHAZINE -DM Take 5 mLs by mouth 4 (four) times daily as needed.        Follow-up Information     April Cassondra BROCKS, MD. Schedule an appointment as soon as possible for a visit in 1 week(s).   Specialty: Family Medicine Contact information: [address] [place] [address] [phone]         [name]. Schedule an appointment as soon as possible for a visit in 1 week(s).   Contact information: [address] [place]  [phone]               Discharge Exam: April Ibarra   04/23/24 1844  Weight: 89.4 kg   Constitutional: NAD, calm, comfortable Eyes: PERRL, lids and conjunctivae normal ENMT: Mucous membranes are moist. Posterior pharynx clear of any exudate or lesions.Normal dentition.  Neck: normal, supple, no masses, no thyromegaly Respiratory: clear to auscultation bilaterally, no wheezing, no crackles. Normal respiratory effort. No accessory muscle use.  Cardiovascular: Regular rate and rhythm, no murmurs / rubs / gallops. No extremity edema. 2+ pedal pulses. No carotid bruits.  Abdomen: no tenderness, no masses palpated. No hepatosplenomegaly. Bowel sounds positive.  Musculoskeletal: no clubbing / cyanosis. No joint deformity upper and lower extremities. Good ROM, no contractures. Normal muscle tone.  Skin: no rashes, lesions, ulcers. No induration Neurologic: CN 2-12 grossly intact. Sensation intact, DTR normal. Strength 5/5 x all 4 extremities.  Psychiatric: Normal judgment and insight. Alert and  oriented x 3. Normal mood.    Condition at discharge: good  The results of significant diagnostics from this hospitalization (including imaging, microbiology, ancillary and laboratory) are listed below for reference.   Imaging Studies: ECHOCARDIOGRAM COMPLETE BUBBLE STUDY Result Date: 04/24/2024    ECHOCARDIOGRAM REPORT   Patient Name:    April Ibarra Date of Exam: 04/24/2024 Medical Rec #:  [id number]            Height:       67.0 in Accession #:    [id number]           Weight:       197.0 lb Date of Birth:  04/07/1966            BSA:          2.009 m Patient Age:    58 years             BP:           113/60 mmHg Patient Gender: F                    HR:           71 bpm. Exam Location:  [facility] Procedure: 2D Echo, Cardiac Doppler, Color Doppler, Intracardiac Opacification            Agent and Saline Contrast Bubble Study (Both Spectral and Color Flow            Doppler were utilized during procedure). Indications:     Stroke 434.91 / I63.9  History:         Patient has no prior history of Echocardiogram examinations.                  Stroke.  Sonographer:     April Ibarra Referring Phys:  [id number] April Ibarra Diagnosing Phys: April Dooms MD IMPRESSIONS  1. Left ventricular ejection fraction, by estimation, is 45 to 50%. The left ventricle has mildly decreased function. The left ventricle has no regional wall motion abnormalities. Left ventricular diastolic parameters were normal.  2. Right ventricular systolic function is normal. The right ventricular size is mildly enlarged.  3. The mitral valve is normal in structure. Trivial mitral valve regurgitation. No evidence of mitral stenosis.  4. The aortic valve is normal in structure. Aortic valve regurgitation is not visualized. No aortic stenosis is present.  5. The inferior vena cava is normal in size with greater than 50% respiratory variability, suggesting right atrial pressure of 3 mmHg. FINDINGS  Left Ventricle: Left ventricular ejection fraction, by estimation, is 45 to 50%. The left ventricle has mildly decreased function. The left ventricle has no regional wall motion abnormalities. Definity  contrast agent was given IV to delineate the left ventricular endocardial borders. Strain was performed and the global longitudinal strain is indeterminate. The left ventricular internal  cavity size was normal in size. There is no left ventricular hypertrophy. Left ventricular diastolic parameters were normal. Right Ventricle: The right ventricular size is mildly enlarged. No increase in right ventricular wall thickness. Right ventricular systolic function is normal. Left Atrium: Left atrial size was normal in size. Right Atrium: Right atrial size was normal in size. Pericardium: There is no evidence of pericardial effusion. Mitral Valve: The mitral valve is normal in structure. Trivial mitral valve regurgitation. No evidence of mitral valve stenosis. MV peak gradient, 5.4 mmHg. The mean mitral valve gradient is 2.0 mmHg. Tricuspid Valve: The tricuspid valve is normal in structure. Tricuspid valve  regurgitation is trivial. No evidence of tricuspid stenosis. Aortic Valve: The aortic valve is normal in structure. Aortic valve regurgitation is not visualized. No aortic stenosis is present. Aortic valve mean gradient measures 5.0 mmHg. Aortic valve peak gradient measures 11.0 mmHg. Aortic valve area, by VTI measures 1.59 cm. Pulmonic Valve: The pulmonic valve was normal in structure. Pulmonic valve regurgitation is not visualized. No evidence of pulmonic stenosis. Aorta: The aortic root is normal in size and structure. Venous: The inferior vena cava is normal in size with greater than 50% respiratory variability, suggesting right atrial pressure of 3 mmHg. IAS/Shunts: No atrial level shunt detected by color flow Doppler. Agitated saline contrast was given intravenously to evaluate for intracardiac shunting. Additional Comments: 3D was performed not requiring image post processing on an independent workstation and was indeterminate.  LEFT VENTRICLE PLAX 2D LVIDd:         5.10 cm      Diastology LVIDs:         3.80 cm      LV e' medial:    7.07 cm/s LV PW:         1.00 cm      LV E/e' medial:  11.4 LV IVS:        0.80 cm      LV e' lateral:   8.70 cm/s LVOT diam:     2.10 cm      LV E/e' lateral: 9.2 LV  SV:         56 LV SV Index:   28 LVOT Area:     3.46 cm LV IVRT:       109 msec  LV Volumes (MOD) LV vol d, MOD A2C: 83.9 ml LV vol d, MOD A4C: 122.0 ml LV vol s, MOD A2C: 56.5 ml LV vol s, MOD A4C: 61.7 ml LV SV MOD A2C:     27.4 ml LV SV MOD A4C:     122.0 ml LV SV MOD BP:      41.6 ml RIGHT VENTRICLE RV Basal diam:  4.80 cm RV S prime:     12.10 cm/s TAPSE (M-mode): 2.1 cm LEFT ATRIUM             Index        RIGHT ATRIUM           Index LA diam:        4.00 cm 1.99 cm/m   RA Area:     18.20 cm LA Vol (A2C):   63.4 ml 31.55 ml/m  RA Volume:   54.80 ml  27.27 ml/m LA Vol (A4C):   49.4 ml 24.59 ml/m LA Biplane Vol: 55.9 ml 27.82 ml/m  AORTIC VALVE                     PULMONIC VALVE AV Area (Vmax):    1.95 cm      PV Vmax:        1.09 m/s AV Area (Vmean):   1.88 cm      PV Vmean:       68.800 cm/s AV Area (VTI):     1.59 cm      PV VTI:         0.219 m AV Vmax:           166.00 cm/s   PV Peak grad:   4.8 mmHg AV Vmean:          110.000 cm/s  PV Mean grad:  2.0 mmHg AV VTI:            0.355 m       RVOT Peak grad: 2 mmHg AV Peak Grad:      11.0 mmHg AV Mean Grad:      5.0 mmHg LVOT Vmax:         93.30 cm/s LVOT Vmean:        59.600 cm/s LVOT VTI:          0.163 m LVOT/AV VTI ratio: 0.46  AORTA Ao Root diam: 2.90 cm Ao Asc diam:  2.40 cm MITRAL VALVE MV Area (PHT): 3.65 cm    SHUNTS MV Area VTI:   1.51 cm    Systemic VTI:  0.16 m MV Peak grad:  5.4 mmHg    Systemic Diam: 2.10 cm MV Mean grad:  2.0 mmHg    Pulmonic VTI:  0.188 m MV Vmax:       1.16 m/s MV Vmean:      62.8 cm/s MV Decel Time: 208 msec MV E velocity: 80.40 cm/s MV A velocity: 78.80 cm/s MV E/A ratio:  1.02 April Dooms MD Electronically signed by April Dooms MD Signature Date/Time: 04/24/2024/12:38:15 PM    Final    MR BRAIN WO CONTRAST Result Date: 04/24/2024 EXAM: MRI Brain Without Contrast 04/23/2024 11:34:37 PM TECHNIQUE: Multiplanar multisequence MRI of the head/brain was performed without the administration of  intravenous contrast. COMPARISON: Same day CTA head and neck. CLINICAL HISTORY: Neuro deficit, acute, stroke suspected FINDINGS: BRAIN AND VENTRICLES: No acute infarct. No intracranial hemorrhage. No mass. No midline shift. No hydrocephalus. The sella is unremarkable. Normal flow voids. ORBITS: No acute abnormality. SINUSES AND MASTOIDS: No acute abnormality. BONES AND SOFT TISSUES: Normal marrow signal. No acute soft tissue abnormality. IMPRESSION: 1. No acute intracranial abnormality. Electronically signed by: Gilmore Molt MD 04/24/2024 12:05 AM EST RP Workstation: [id number]   CT ANGIO HEAD NECK W WO CM (CODE STROKE) Result Date: 04/23/2024 EXAM: CTA HEAD AND NECK WITH AND WITHOUT 04/23/2024 08:35:48 PM TECHNIQUE: CTA of the head and neck was performed with and without the administration of intravenous contrast. Multiplanar 2D and/or 3D reformatted images are provided for review. Automated exposure control, iterative reconstruction, and/or weight based adjustment of the mA/kV was utilized to reduce the radiation dose to as low as reasonably achievable. Stenosis of the internal carotid arteries measured using NASCET criteria. COMPARISON: CT head earlier today. CLINICAL HISTORY: Syncope/presyncope, cerebrovascular cause suspected FINDINGS: AORTIC ARCH AND ARCH VESSELS: No dissection or arterial injury. No significant stenosis of the brachiocephalic or subclavian arteries. CERVICAL CAROTID ARTERIES: No dissection, arterial injury, or hemodynamically significant stenosis by NASCET criteria. CERVICAL VERTEBRAL ARTERIES: No dissection, arterial injury, or significant stenosis. LUNGS AND MEDIASTINUM: Unremarkable. SOFT TISSUES: No acute abnormality. BONES: No acute abnormality. ANTERIOR CIRCULATION: No significant stenosis of the internal carotid arteries. No significant stenosis of the anterior cerebral arteries. No significant stenosis of the middle cerebral arteries. No aneurysm. POSTERIOR CIRCULATION: No  significant stenosis of the posterior cerebral arteries. No significant stenosis of the basilar artery. No significant stenosis of the vertebral arteries. No aneurysm. OTHER: No dural venous sinus thrombosis on this non-dedicated study. IMPRESSION: 1. No large vessel occlusion or hemodynamically significant stenosis. Electronically signed by: Gilmore Molt MD 04/23/2024 08:55 PM EST RP Workstation: [id number]   CT ANGIO HEAD NECK W WO CM (CODE STROKE) Result Date: 04/23/2024 EXAM: CT HEAD WITHOUT 04/23/2024 08:15:30 PM TECHNIQUE: CT of the head was performed without the administration of intravenous contrast.  Automated exposure control, iterative reconstruction, and/or weight based adjustment of the mA/kV was utilized to reduce the radiation dose to as low as reasonably achievable. COMPARISON: None available. CLINICAL HISTORY: Neuro deficit, acute, stroke suspected FINDINGS: BRAIN AND VENTRICLES: No acute intracranial hemorrhage. No mass effect or midline shift. No extra-axial fluid collection. No evidence of acute infarct. No hydrocephalus. ORBITS: No acute abnormality. SINUSES AND MASTOIDS: No acute abnormality. SOFT TISSUES AND SKULL: No acute skull fracture. No acute soft tissue abnormality. Findings discussed with Dr. Nicholaus via telephone at 8:24 PM. IMPRESSION: 1. No acute intracranial abnormality. Electronically signed by: Gilmore Molt MD 04/23/2024 08:25 PM EST RP Workstation: [id number]    Microbiology: Results for orders placed or performed during the hospital encounter of 03/05/22  SARS Coronavirus 2 by RT PCR (hospital order, performed in [facility] hospital lab) *cepheid single result test* Anterior Nasal Swab     Status: Abnormal   Collection Time: 03/05/22  8:37 AM   Specimen: Anterior Nasal Swab  Result Value Ref Range Status   SARS Coronavirus 2 by RT PCR POSITIVE (A) NEGATIVE Final    Comment: (NOTE) SARS-CoV-2 target nucleic acids are DETECTED  SARS-CoV-2 RNA is generally  detectable in upper respiratory specimens  during the acute phase of infection.  Positive results are indicative  of the presence of the identified virus, but do not rule out bacterial infection or co-infection with other pathogens not detected by the test.  Clinical correlation with patient history and  other diagnostic information is necessary to determine patient infection status.  The expected result is negative.  Fact Sheet for Patients:   roadlaptop.co.za   Fact Sheet for Healthcare Providers:   http://kim-miller.com/    This test is not yet approved or cleared by the [place]  FDA and  has been authorized for detection and/or diagnosis of SARS-CoV-2 by FDA under an Emergency Use Authorization (EUA).  This EUA will remain in effect (meaning this test can be used) for the duration of  the COVID-19 declaration under Section 564(b)(1)  of the Act, 21 U.S.C. section 360-bbb-3(b)(1), unless the authorization is terminated or revoked sooner.   Performed at [facility], [address], [place], [place] [address]     Labs: CBC: Recent Labs  Lab 04/23/24 1846  WBC 10.2  NEUTROABS 7.3  HGB 13.0  HCT 38.6  MCV 89.6  PLT 217   Basic Metabolic Panel: Recent Labs  Lab 04/23/24 1842 04/23/24 1846  NA  --  138  K  --  3.7  CL  --  101  CO2  --  25  GLUCOSE  --  136*  BUN  --  21*  CREATININE  --  0.83  CALCIUM   --  8.4*  MG 2.0  --    Liver Function Tests: Recent Labs  Lab 04/23/24 1846  AST 14*  ALT 18  ALKPHOS 67  BILITOT 0.3  PROT 6.6  ALBUMIN 4.0   CBG: Recent Labs  Lab 04/23/24 2017  GLUCAP 130*    Discharge time spent: 40 minutes.  Signed: Deliliah Room, MD [facility] 04/24/2024

## 2024-04-24 NOTE — Consult Note (Signed)
 Reason for Consult:Dizziness Requesting Physician: Dino  CC: Dizziness  I have been asked by Dr. Dino to see this patient in consultation for dizziness.  HPI: April Ibarra is an 58 y.o. female with medical history significant of sCHF with EF 45-50%, PVC and obesity, who presented on 12/8 with dizziness.  Patient had acute onset of dizziness around 1800.  Dizziness described as vertigo.  Had associated nausea but no vomiting.  Evaluated as a code stroke.  Due to nondisabling symptoms not felt to be a thrombolytic or thrombectomy candidate.    LKW 04/23/2024 at 1800  Past Medical History:  Diagnosis Date   Chronic systolic CHF (congestive heart failure) (HCC)    Obesity (BMI 30.0-34.9)    Plantar fascia syndrome    PVC (premature ventricular contraction)     Past Surgical History:  Procedure Laterality Date   ABLATION ON ENDOMETRIOSIS      Family History  Problem Relation Age of Onset   Hypertension Mother    Arrhythmia Mother    Hypertension Father     Social History:  reports that she has never smoked. She has never used smokeless tobacco. She reports that she does not currently use alcohol. She reports that she does not use drugs.  No Known Allergies  Medications: I have reviewed the patient's current medications. Prior to Admission:  Prior to Admission medications   Medication Sig Start Date End Date Taking? Authorizing Provider  Multiple Vitamin (MULTIVITAMIN WITH MINERALS) TABS tablet Take 1 tablet by mouth daily.   Yes [provider]  benzonatate  (TESSALON ) 200 MG capsule Take one cap TID PRN cough Patient not taking: Reported on 04/24/2024 03/05/22   Brimage, Vondra, DO  promethazine -dextromethorphan (PROMETHAZINE -DM) 6.25-15 MG/5ML syrup Take 5 mLs by mouth 4 (four) times daily as needed. Patient not taking: Reported on 04/24/2024 03/05/22   Kriste Berth, DO    Scheduled:   stroke: early stages of recovery book   Does not apply Once    aspirin   300 mg Rectal Daily   Or   aspirin   325 mg Oral Daily   atorvastatin   40 mg Oral Daily   enoxaparin  (LOVENOX ) injection  45 mg Subcutaneous Q24H    ROS: History obtained from the patient  General ROS: negative for - chills, fatigue, fever, night sweats, weight gain or weight loss Psychological ROS: negative for - behavioral disorder, hallucinations, memory difficulties, mood swings or suicidal ideation Ophthalmic ROS: negative for - blurry vision, double vision, eye pain or loss of vision ENT ROS: negative for - epistaxis, nasal discharge, oral lesions, sore throat, tinnitus  Allergy and Immunology ROS: negative for - hives or itchy/watery eyes Hematological and Lymphatic ROS: negative for - bleeding problems, bruising or swollen lymph nodes Endocrine ROS: negative for - galactorrhea, hair pattern changes, polydipsia/polyuria or temperature intolerance Respiratory ROS: negative for - cough, hemoptysis, shortness of breath or wheezing Cardiovascular ROS: negative for - chest pain, dyspnea on exertion, edema or irregular heartbeat Gastrointestinal ROS: negative for - abdominal pain, diarrhea, hematemesis, nausea/vomiting or stool incontinence Genito-Urinary ROS: negative for - dysuria, hematuria, incontinence or urinary frequency/urgency Musculoskeletal ROS: negative for - joint swelling or muscular weakness Neurological ROS: as noted in HPI Dermatological ROS: negative for rash and skin lesion changes   Physical Examination: Blood pressure 113/60, pulse 86, temperature 98.6 F (37 C), temperature source Oral, resp. rate 19, height 5' 7 (1.702 m), weight 89.4 kg, last menstrual period 04/07/2024, SpO2 100%.  HEENT-  Normocephalic, no lesions, without obvious  abnormality.  Normal external eye and conjunctiva.  Normal external nose, mucus membranes and septum.   Cardiovascular- Single S1, S2 Lungs- CTA Abdomen- soft, non-tender; bowel sounds normal; no masses,  no  organomegaly Extremities- no edema Musculoskeletal-no joint tenderness, deformity or swelling Skin-warm and dry, no hyperpigmentation, vitiligo, or suspicious lesions  Neurological Examination   Mental Status: Alert, oriented, thought content appropriate.  Speech fluent without evidence of aphasia.  Able to follow 3 step commands without difficulty. Cranial Nerves: II: Discs flat bilaterally; Visual fields grossly normal, pupils equal, round, reactive to light and accommodation III,IV, VI: ptosis not present, extra-ocular motions intact bilaterally V,VII: smile symmetric, facial light touch sensation normal bilaterally VIII: hearing normal bilaterally XI: bilateral shoulder shrug XII: midline tongue extension Motor: Right : Upper extremity   5/5    Left:     Upper extremity   5/5  Lower extremity   5/5     Lower extremity   5/5 Tone and bulk:normal tone throughout; no atrophy noted Sensory: Pinprick and light touch intact throughout, bilaterally Deep Tendon Reflexes: 2+ and symmetric with absent AJs bilaterally Plantars: Right: downgoing   Left: downgoing Cerebellar: Normal rapid alternating movements and normal heel-to-shin testing bilaterally Gait: not tested due to safety concerns      Laboratory Studies:   Basic Metabolic Panel: Recent Labs  Lab 04/23/24 1842 04/23/24 1846  NA  --  138  K  --  3.7  CL  --  101  CO2  --  25  GLUCOSE  --  136*  BUN  --  21*  CREATININE  --  0.83  CALCIUM   --  8.4*  MG 2.0  --     Liver Function Tests: Recent Labs  Lab 04/23/24 1846  AST 14*  ALT 18  ALKPHOS 67  BILITOT 0.3  PROT 6.6  ALBUMIN 4.0   No results for input(s): LIPASE, AMYLASE in the last 168 hours. No results for input(s): AMMONIA in the last 168 hours.  CBC: Recent Labs  Lab 04/23/24 1846  WBC 10.2  NEUTROABS 7.3  HGB 13.0  HCT 38.6  MCV 89.6  PLT 217    Cardiac Enzymes: No results for input(s): CKTOTAL, CKMB, CKMBINDEX, TROPONINI  in the last 168 hours.  BNP: Invalid input(s): POCBNP  CBG: Recent Labs  Lab 04/23/24 2017  GLUCAP 130*    Microbiology: Results for orders placed or performed during the hospital encounter of 03/05/22  SARS Coronavirus 2 by RT PCR (hospital order, performed in [facility] hospital lab) *cepheid single result test* Anterior Nasal Swab     Status: Abnormal   Collection Time: 03/05/22  8:37 AM   Specimen: Anterior Nasal Swab  Result Value Ref Range Status   SARS Coronavirus 2 by RT PCR POSITIVE (A) NEGATIVE Final    Comment: (NOTE) SARS-CoV-2 target nucleic acids are DETECTED  SARS-CoV-2 RNA is generally detectable in upper respiratory specimens  during the acute phase of infection.  Positive results are indicative  of the presence of the identified virus, but do not rule out bacterial infection or co-infection with other pathogens not detected by the test.  Clinical correlation with patient history and  other diagnostic information is necessary to determine patient infection status.  The expected result is negative.  Fact Sheet for Patients:   roadlaptop.co.za   Fact Sheet for Healthcare Providers:   http://kim-miller.com/    This test is not yet approved or cleared by the [place]  FDA and  has been authorized  for detection and/or diagnosis of SARS-CoV-2 by FDA under an Emergency Use Authorization (EUA).  This EUA will remain in effect (meaning this test can be used) for the duration of  the COVID-19 declaration under Section 564(b)(1)  of the Act, 21 U.S.C. section 360-bbb-3(b)(1), unless the authorization is terminated or revoked sooner.   Performed at [facility], [address], [place], [place] [address]     Coagulation Studies: Recent Labs    04/23/24 2001  LABPROT 15.2  INR 1.1    Urinalysis:  Recent Labs  Lab 04/23/24 2325  COLORURINE COLORLESS*  LABSPEC 1.016  PHURINE 8.0   GLUCOSEU NEGATIVE  HGBUR NEGATIVE  BILIRUBINUR NEGATIVE  KETONESUR NEGATIVE  PROTEINUR NEGATIVE  NITRITE NEGATIVE  LEUKOCYTESUR NEGATIVE    Lipid Panel:     Component Value Date/Time   CHOL 172 04/24/2024 0449   TRIG 83 04/24/2024 0449   HDL 57 04/24/2024 0449   CHOLHDL 3.0 04/24/2024 0449   VLDL 17 04/24/2024 0449   LDLCALC 98 04/24/2024 0449    HgbA1C:  Lab Results  Component Value Date   HGBA1C 5.0 04/23/2024    Urine Drug Screen:      Component Value Date/Time   LABOPIA NEGATIVE 04/23/2024 2325   COCAINSCRNUR NEGATIVE 04/23/2024 2325   LABBENZ NEGATIVE 04/23/2024 2325   AMPHETMU NEGATIVE 04/23/2024 2325   THCU NEGATIVE 04/23/2024 2325   LABBARB NEGATIVE 04/23/2024 2325    Alcohol Level: No results for input(s): ETH in the last 168 hours.   Imaging: MR BRAIN WO CONTRAST Result Date: 04/24/2024 EXAM: MRI Brain Without Contrast 04/23/2024 11:34:37 PM TECHNIQUE: Multiplanar multisequence MRI of the head/brain was performed without the administration of intravenous contrast. COMPARISON: Same day CTA head and neck. CLINICAL HISTORY: Neuro deficit, acute, stroke suspected FINDINGS: BRAIN AND VENTRICLES: No acute infarct. No intracranial hemorrhage. No mass. No midline shift. No hydrocephalus. The sella is unremarkable. Normal flow voids. ORBITS: No acute abnormality. SINUSES AND MASTOIDS: No acute abnormality. BONES AND SOFT TISSUES: Normal marrow signal. No acute soft tissue abnormality. IMPRESSION: 1. No acute intracranial abnormality. Electronically signed by: Gilmore Molt MD 04/24/2024 12:05 AM EST RP Workstation: [id number]   CT ANGIO HEAD NECK W WO CM (CODE STROKE) Result Date: 04/23/2024 EXAM: CTA HEAD AND NECK WITH AND WITHOUT 04/23/2024 08:35:48 PM TECHNIQUE: CTA of the head and neck was performed with and without the administration of intravenous contrast. Multiplanar 2D and/or 3D reformatted images are provided for review. Automated exposure control,  iterative reconstruction, and/or weight based adjustment of the mA/kV was utilized to reduce the radiation dose to as low as reasonably achievable. Stenosis of the internal carotid arteries measured using NASCET criteria. COMPARISON: CT head earlier today. CLINICAL HISTORY: Syncope/presyncope, cerebrovascular cause suspected FINDINGS: AORTIC ARCH AND ARCH VESSELS: No dissection or arterial injury. No significant stenosis of the brachiocephalic or subclavian arteries. CERVICAL CAROTID ARTERIES: No dissection, arterial injury, or hemodynamically significant stenosis by NASCET criteria. CERVICAL VERTEBRAL ARTERIES: No dissection, arterial injury, or significant stenosis. LUNGS AND MEDIASTINUM: Unremarkable. SOFT TISSUES: No acute abnormality. BONES: No acute abnormality. ANTERIOR CIRCULATION: No significant stenosis of the internal carotid arteries. No significant stenosis of the anterior cerebral arteries. No significant stenosis of the middle cerebral arteries. No aneurysm. POSTERIOR CIRCULATION: No significant stenosis of the posterior cerebral arteries. No significant stenosis of the basilar artery. No significant stenosis of the vertebral arteries. No aneurysm. OTHER: No dural venous sinus thrombosis on this non-dedicated study. IMPRESSION: 1. No large vessel occlusion or hemodynamically significant  stenosis. Electronically signed by: Gilmore Molt MD 04/23/2024 08:55 PM EST RP Workstation: [id number]   CT ANGIO HEAD NECK W WO CM (CODE STROKE) Result Date: 04/23/2024 EXAM: CT HEAD WITHOUT 04/23/2024 08:15:30 PM TECHNIQUE: CT of the head was performed without the administration of intravenous contrast. Automated exposure control, iterative reconstruction, and/or weight based adjustment of the mA/kV was utilized to reduce the radiation dose to as low as reasonably achievable. COMPARISON: None available. CLINICAL HISTORY: Neuro deficit, acute, stroke suspected FINDINGS: BRAIN AND VENTRICLES: No acute  intracranial hemorrhage. No mass effect or midline shift. No extra-axial fluid collection. No evidence of acute infarct. No hydrocephalus. ORBITS: No acute abnormality. SINUSES AND MASTOIDS: No acute abnormality. SOFT TISSUES AND SKULL: No acute skull fracture. No acute soft tissue abnormality. Findings discussed with Dr. Nicholaus via telephone at 8:24 PM. IMPRESSION: 1. No acute intracranial abnormality. Electronically signed by: Gilmore Molt MD 04/23/2024 08:25 PM EST RP Workstation: [id number]     Assessment/Plan:  58 y.o. female with medical history significant of sCHF with EF 45-50%, PVC and obesity, who presented on 12/8 with dizziness.  Patient had acute onset of dizziness around 1800.  Dizziness described as vertigo.  Had associated nausea but no vomiting.  Evaluated as a code stroke.  Due to nondisabling symptoms not felt to be a thrombolytic or thrombectomy candidate.  Reports improvement in dizziness today.  Feels Zofran  helped. MRI of the brain personally reviewed and shows no acute changes.   A1c 5.0, LDL 98.  Echocardiogram reveals EF of 45-50% with no cardiac source of emboli identified. With unremarkable imaging, nonfocal neurological examination and continued symptoms would suspect vertigo has a peripheral etiology.  Recommendations: Continue ASA 81mg  daily May use Meclizine  12.5-25mg  prn q 8 hours for continued vertigo Patient to follow up with ENT on an outpatient basis.    Case discussed with Dr. Dino Sonny Hock, MD Neurology  04/24/2024, 9:00 AM

## 2024-04-24 NOTE — Progress Notes (Signed)
 OT Cancellation Note  Patient Details Name: April Ibarra MRN: [id number] DOB: [birth year]   Cancelled Treatment:    Reason Eval/Treat Not Completed: Patient at procedure or test/ unavailable. Pt currently with team for ECHO. RN states pt has been ambulatory to bathroom without difficulties or c/o dizziness. OT will check back as able.  Ailin Rochford L. Lauran Romanski, OTR/L  04/24/24, 8:57 AM

## 2024-04-24 NOTE — Progress Notes (Signed)
 SLP Cancellation Note  Patient Details Name: April Ibarra MRN: [id number] DOB: February 28, 1966   Cancelled treatment:       Reason Eval/Treat Not Completed: SLP screened, no needs identified, will sign off (Per chart review, stroke work up negative, thus far, and NIHSS = 0. Pt with c/o dizziness. No complaints re: speech/language/cognition. ST to s/o.)  Delon Bangs, M.S., CCC-SLP Speech-Language Pathologist [facility] [phone] [place])  Delon CHRISTELLA Bangs 04/24/2024, 8:05 AM

## 2024-04-24 NOTE — ED Notes (Signed)
 Patient ambulatory to bathroom and back to room with steady gait unassisted.

## 2024-04-24 NOTE — ED Notes (Signed)
 Patient ambulatory to bathroom with steady gait.
